# Patient Record
Sex: Female | Born: 1990
Health system: Southern US, Community
[De-identification: ages and names within clinical notes are randomized; demographics above are authoritative.]

## PROBLEM LIST (undated history)

## (undated) ENCOUNTER — Inpatient Hospital Stay: Payer: Self-pay

## (undated) DIAGNOSIS — R519 Headache, unspecified: Secondary | ICD-10-CM

## (undated) DIAGNOSIS — R51 Headache: Secondary | ICD-10-CM

## (undated) DIAGNOSIS — Z8759 Personal history of other complications of pregnancy, childbirth and the puerperium: Secondary | ICD-10-CM

## (undated) HISTORY — PX: NO PAST SURGERIES: SHX2092

## (undated) HISTORY — DX: Headache, unspecified: R51.9

## (undated) HISTORY — DX: Headache: R51

## (undated) HISTORY — DX: Personal history of other complications of pregnancy, childbirth and the puerperium: Z87.59

---
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
## 2012-12-25 ENCOUNTER — Ambulatory Visit: Payer: Self-pay | Admitting: Internal Medicine

## 2012-12-31 ENCOUNTER — Ambulatory Visit (INDEPENDENT_AMBULATORY_CARE_PROVIDER_SITE_OTHER): Payer: Managed Care, Other (non HMO) | Admitting: Internal Medicine

## 2012-12-31 ENCOUNTER — Encounter: Payer: Self-pay | Admitting: Internal Medicine

## 2012-12-31 VITALS — BP 102/70 | HR 91 | Temp 97.6°F | Ht 65.0 in | Wt 159.0 lb

## 2012-12-31 DIAGNOSIS — Z3009 Encounter for other general counseling and advice on contraception: Secondary | ICD-10-CM

## 2012-12-31 DIAGNOSIS — J029 Acute pharyngitis, unspecified: Secondary | ICD-10-CM

## 2012-12-31 DIAGNOSIS — Z0000 Encounter for general adult medical examination without abnormal findings: Secondary | ICD-10-CM

## 2012-12-31 LAB — BASIC METABOLIC PANEL
Chloride: 105 mEq/L (ref 96–112)
GFR: 81.26 mL/min (ref >60.00)
Glucose, Bld: 68 mg/dL — ABNORMAL LOW (ref 70–99)
Potassium: 4.1 mEq/L (ref 3.5–5.1)
Sodium: 138 mEq/L (ref 135–145)

## 2012-12-31 LAB — POCT URINE PREGNANCY: Preg Test, Ur: NEGATIVE

## 2012-12-31 LAB — LIPID PANEL
Cholesterol: 153 mg/dL (ref 0–200)
HDL: 37.7 mg/dL — ABNORMAL LOW (ref >39.00)
LDL Cholesterol: 88 mg/dL (ref 0–99)
VLDL: 27 mg/dL (ref 0.0–40.0)

## 2012-12-31 LAB — CBC
MCV: 92.7 fl (ref 78.0–100.0)
RBC: 4.1 Mil/uL (ref 3.87–5.11)
RDW: 12.8 % (ref 11.5–14.6)
WBC: 6 10*3/uL (ref 4.5–10.5)

## 2012-12-31 LAB — TSH: TSH: 0.44 u[IU]/mL (ref 0.35–5.50)

## 2012-12-31 LAB — POCT RAPID STREP A (OFFICE): Rapid Strep A Screen: NEGATIVE

## 2012-12-31 MED ORDER — AMOXICILLIN 500 MG PO CAPS: 500.0000 mg | ORAL_CAPSULE | Freq: Three times a day (TID) | ORAL | Status: DC

## 2012-12-31 MED ORDER — PREDNISONE 10 MG PO TABS: ORAL_TABLET | ORAL | Status: DC

## 2012-12-31 MED ORDER — MEDROXYPROGESTERONE ACETATE 150 MG/ML IM SUSP
150.0000 mg | Freq: Once | INTRAMUSCULAR | Status: AC
  Administered 2012-12-31: 150 mg via INTRAMUSCULAR

## 2012-12-31 NOTE — Addendum Note (Signed)
Addended by: Lorre Munroe on: 12/31/2012 11:09 AM   Modules accepted: Orders

## 2012-12-31 NOTE — Patient Instructions (Signed)
Health Maintenance, 20- to 21-Year-Old SCHOOL PERFORMANCE After high school completion, the young adult may be attending college, technical or vocational school, or entering the military or the work force. SOCIAL AND EMOTIONAL DEVELOPMENT The young adult establishes adult relationships and explores sexual identity. Young adults may be living at home or in a college dorm or apartment. Increasing independence is important with young adults. Throughout adolescence, teens should assume responsibility of their own health care. IMMUNIZATIONS Most young adults should be fully vaccinated. A booster dose of Tdap (tetanus, diphtheria, and pertussis, or "whooping cough"), a dose of meningococcal vaccine to protect against a certain type of bacterial meningitis, hepatitis A, human papillomarvirus (HPV), chickenpox, or measles vaccines may be indicated, if not given at an earlier age. Annual influenza or "flu" vaccination should be considered during flu season.  TESTING Annual screening for vision and hearing problems is recommended. Vision should be screened objectively at least once between 20 and 21 years of age. The young adult may be screened for anemia or tuberculosis. Young adults should have a blood test to check for high cholesterol during this time period. Young adults should be screened for use of alcohol and drugs. If the young adult is sexually active, screening for sexually transmitted infections, pregnancy, or HIV may be performed. Screening for cervical cancer should be performed within 3 years of beginning sexual activity. NUTRITION AND ORAL HEALTH  Adequate calcium intake is important. Consume 3 servings of low-fat milk and dairy products daily. For those who do not drink milk or consume dairy products, calcium enriched foods, such as juice, bread, or cereal, dark, leafy greens, or canned fish are alternate sources of calcium.  Drink plenty of water. Limit fruit juice to 8 to 12 ounces per day.  Avoid sugary beverages or sodas.  Discourage skipping meals, especially breakfast. Teens should eat a good variety of vegetables and fruits, as well as lean meats.  Avoid high fat, high salt, and high sugar foods, such as candy, chips, and cookies.  Encourage young adults to participate in meal planning and preparation.  Eat meals together as a family whenever possible. Encourage conversation at mealtime.  Limit fast food choices and eating out at restaurants.  Brush teeth twice a day and floss.  Schedule dental exams twice a year. SLEEP Regular sleep habits are important. PHYSICAL, SOCIAL, AND EMOTIONAL DEVELOPMENT  One hour of regular physical activity daily is recommended. Continue to participate in sports.  Encourage young adults to develop their own interests and consider community service or volunteerism.  Provide guidance to the young adult in making decisions about college and work plans.  Make sure that young adults know that they should never be in a situation that makes them uncomfortable, and they should tell partners if they do not want to engage in sexual activity.  Talk to the young adult about body image. Eating disorders may be noted at this time. Young adults may also be concerned about being overweight. Monitor the young adult for weight gain or loss.  Mood disturbances, depression, anxiety, alcoholism, or attention problems may be noted in young adults. Talk to the caregiver if there are concerns about mental illness.  Negotiate limit setting and independent decision making.  Encourage the young adult to handle conflict without physical violence.  Avoid loud noises which may impair hearing.  Limit television and computer time to 2 hours per day. Individuals who engage in excessive sedentary activity are more likely to become overweight. RISK BEHAVIORS  Sexually active   young adults need to take precautions against pregnancy and sexually transmitted  infections. Talk to young adults about contraception.  Provide a tobacco-free and drug-free environment for the young adult. Talk to the young adult about drug, tobacco, and alcohol use among friends or at friends' homes. Make sure the young adult knows that smoking tobacco or marijuana and taking drugs have health consequences and may impact brain development.  Teach the young adult about appropriate use of over-the-counter or prescription medicines.  Establish guidelines for driving and for riding with friends.  Talk to young adults about the risks of drinking and driving or boating. Encourage the young adult to call you if he or she or friends have been drinking or using drugs.  Remind young adults to wear seat belts at all times in cars and life vests in boats.  Young adults should always wear a properly fitted helmet when they are riding a bicycle.  Use caution with all-terrain vehicles (ATVs) or other motorized vehicles.  Do not keep handguns in the home. (If you do, the gun and ammunition should be locked separately and out of the young adult's access.)  Equip your home with smoke detectors and change the batteries regularly. Make sure all family members know the fire escape plans for your home.  Teach young adults not to swim alone and not to dive in shallow water.  All individuals should wear sunscreen that protects against UVA and UVB light with at least a sun protection factor (SPF) of 30 when out in the sun. This minimizes sun burning. WHAT'S NEXT? Young adults should visit their pediatrician or family physician yearly. By young adulthood, health care should be transitioned to a family physician or internal medicine specialist. Sexually active females may want to begin annual physical exams with a gynecologist. Document Released: 06/22/2006 Document Revised: 06/19/2011 Document Reviewed: 07/12/2006 Exit[employer] Patient Information 2014 [employer].  

## 2012-12-31 NOTE — Progress Notes (Signed)
HPI  Pt presents to the clinic today to establish care. She is transferring care from [facility]. She does have some concerns today with ongoing URI. This has persisted for the last 3 weeks. She has been seen at Urgent Care twice for the same. The first time, they gave her azithromax and cough syrup with codeine. She went back after having an allergic reaction to the codeine. She was given a steroid injection, put on a prednisone taper, given tessalon pearls. Since that time, her symptoms have not gotten any better. She still c/o a sore throat, fatigue and drainage. She has not had fevers in the last week but she has developed a rash. She has been taking Zyrtec, Sudafed and Ibuprofen OTC without much relief. She has a history of allergies but denies asthma or breathing problems.  Flu: never Tetanus: 2012 LMP: 12/02/2012 Dentist: yearly  History reviewed. No pertinent past medical history.  Current Outpatient Prescriptions  Medication Sig Dispense Refill  . cetirizine (ZYRTEC) 10 MG tablet Take 10 mg by mouth daily.       No current facility-administered medications for this visit.    Allergies  Allergen Reactions  . Codeine Rash    Not sure.  Had a break-out prior to Codeine but it became much worse after starting Codeine.    History reviewed. No pertinent family history.  History   Social History  . Marital Status: Single    Spouse Name: N/A    Number of Children: N/A  . Years of Education: N/A   Occupational History  . Not on file.   Social History Main Topics  . Smoking status: Never Smoker   . Smokeless tobacco: Never Used  . Alcohol Use: No  . Drug Use: No  . Sexual Activity: Yes    Partners: Female   Other Topics Concern  . Not on file   Social History Narrative  . No narrative on file    ROS:  Constitutional: Pt reports fatigue and headache. Denies fever, malaise, or abrupt weight changes.  HEENT: Pt reports sore throat. Denies eye pain, eye  redness, ear pain, ringing in the ears, wax buildup, runny nose, nasal congestion, bloody nose Respiratory: Denies difficulty breathing, shortness of breath, cough or sputum production.   Cardiovascular: Denies chest pain, chest tightness, palpitations or swelling in the hands or feet.  Gastrointestinal: Denies abdominal pain, bloating, constipation, diarrhea or blood in the stool.  GU: Denies frequency, urgency, pain with urination, blood in urine, odor or discharge. Musculoskeletal: Pt reports right knee pain. Denies decrease in range of motion, difficulty with gait, muscle pain or joint swelling.  Skin: Pt reports rash. Denies redness, lesions or ulcercations.  Neurological: Denies dizziness, difficulty with memory, difficulty with speech or problems with balance and coordination.   No other specific complaints in a complete review of systems (except as listed in HPI above).  PE:  BP 102/70  Pulse 91  Temp(Src) 97.6 F (36.4 C) (Oral)  Ht 5\' 5"  (1.651 m)  Wt 159 lb (72.122 kg)  BMI 26.46 kg/m2  SpO2 97%  LMP 12/02/2012 Wt Readings from Last 3 Encounters:  12/31/12 159 lb (72.122 kg)    General: Appears her stated age, well developed, well nourished in NAD. HEENT: Head: normal shape and size; Eyes: sclera white, no icterus, conjunctiva pink, PERRLA and EOMs intact; Ears: Tm's gray and intact, normal light reflex; Nose: mucosa pink and moist, septum midline; Throat/Mouth: Teeth present, mucosa erythematous and moist, yellow exudate noted on  bilateral tonsillar pillars, no lesions or ulcerations noted.  Neck: Normal range of motion. Neck supple, trachea midline. No massses, lumps or thyromegaly present.  Cardiovascular: Normal rate and rhythm. S1,S2 noted.  No murmur, rubs or gallops noted. No JVD or BLE edema. No carotid bruits noted. Pulmonary/Chest: Normal effort and positive vesicular breath sounds. No respiratory distress. No wheezes, rales or ronchi noted.  Abdomen: Soft and  nontender. Normal bowel sounds, no bruits noted. No distention or masses noted. Liver, spleen and kidneys non palpable. Musculoskeletal: Normal range of motion. No signs of joint swelling. No difficulty with gait.  Neurological: Alert and oriented. Cranial nerves II-XII intact. Coordination normal. +DTRs bilaterally. Psychiatric: Mood and affect normal. Behavior is normal. Judgment and thought content normal.      Assessment and Plan:  Preventative Health Maintenance:  Pt declines flu shot today Will start pap smears when you turn 23 Screening lab work today Birth control counseling today- pt was on Depo in the past, would like to resume Urine HcG-negative, 150 mg Depo Provera today  RTC in 3 months for repeat Depo injection  Acute pharyngitis, likely strep:  Will repeat RST and send off throat culture eRx for Amoxicillin TID x 10 days eRx for pred taper x 6 days

## 2012-12-31 NOTE — Addendum Note (Signed)
Addended by: Annamarie Major on: 12/31/2012 11:23 AM   Modules accepted: Orders

## 2012-12-31 NOTE — Addendum Note (Signed)
Addended by: Alvina Chou on: 12/31/2012 11:13 AM   Modules accepted: Orders

## 2013-01-02 LAB — CULTURE, GROUP A STREP

## 2013-04-28 ENCOUNTER — Ambulatory Visit (INDEPENDENT_AMBULATORY_CARE_PROVIDER_SITE_OTHER): Payer: Managed Care, Other (non HMO) | Admitting: Internal Medicine

## 2013-04-28 ENCOUNTER — Encounter: Payer: Self-pay | Admitting: Internal Medicine

## 2013-04-28 VITALS — BP 116/78 | HR 98 | Temp 98.5°F | Wt 176.5 lb

## 2013-04-28 DIAGNOSIS — Z30011 Encounter for initial prescription of contraceptive pills: Secondary | ICD-10-CM

## 2013-04-28 DIAGNOSIS — R110 Nausea: Secondary | ICD-10-CM

## 2013-04-28 DIAGNOSIS — R635 Abnormal weight gain: Secondary | ICD-10-CM

## 2013-04-28 DIAGNOSIS — Z3009 Encounter for other general counseling and advice on contraception: Secondary | ICD-10-CM

## 2013-04-28 LAB — POCT URINE PREGNANCY: PREG TEST UR: NEGATIVE

## 2013-04-28 LAB — TSH: TSH: 0.58 u[IU]/mL (ref 0.35–5.50)

## 2013-04-28 MED ORDER — NORGESTIMATE-ETH ESTRADIOL 0.25-35 MG-MCG PO TABS: 1.0000 | ORAL_TABLET | Freq: Every day | ORAL | Status: DC

## 2013-04-28 NOTE — Progress Notes (Signed)
Subjective:    Patient ID: Jaime Mcintosh, female    DOB: [birth year], 23 y.o.   MRN: [id number]  HPI  Pt presents to the clinic today with c/o weight gain. She has had a 16 lb weight gain in the last 6 weeks. She has not had any changes in her diet. She has felt more nauseated. She is sexually active. Her last Depo was in 12/2011. She has not hat a menstrual since her Depo injection. She also wants to talk about other forms of birth control if the Depo is causing her weight gain.   Review of Systems      History reviewed. No pertinent past medical history.  No current outpatient prescriptions on file.   No current facility-administered medications for this visit.    Allergies  Allergen Reactions  . Codeine Rash    Not sure.  Had a break-out prior to Codeine but it became much worse after starting Codeine.    Family History  Problem Relation Age of Onset  . Heart disease Maternal Grandfather   . Diabetes Paternal Grandfather   . Cancer Neg Hx   . Stroke Neg Hx     History   Social History  . Marital Status: Single    Spouse Name: N/A    Number of Children: N/A  . Years of Education: N/A   Occupational History  . Not on file.   Social History Main Topics  . Smoking status: Never Smoker   . Smokeless tobacco: Never Used  . Alcohol Use: No  . Drug Use: No  . Sexual Activity: Yes    Partners: Female   Other Topics Concern  . Not on file   Social History Narrative  . No narrative on file     Constitutional: Denies fever, malaise, fatigue, headache .  GU: Denies urgency, frequency, pain with urination, burning sensation, blood in urine, odor or discharge.   No other specific complaints in a complete review of systems (except as listed in HPI above).  Objective:   Physical Exam   BP 116/78  Pulse 98  Temp(Src) 98.5 F (36.9 C) (Oral)  Wt 176 lb 8 oz (80.06 kg)  SpO2 99%  LMP 12/25/2012 Wt Readings from Last 3 Encounters:  04/28/13 176 lb 8 oz  (80.06 kg)  12/31/12 159 lb (72.122 kg)    General: Appears her stated age, overweight but well developed, well nourished in NAD. Cardiovascular: Normal rate and rhythm. S1,S2 noted.  No murmur, rubs or gallops noted. No JVD or BLE edema. No carotid bruits noted. Pulmonary/Chest: Normal effort and positive vesicular breath sounds. No respiratory distress. No wheezes, rales or ronchi noted.  Abdomen: Soft and nontender. Normal bowel sounds, no bruits noted. No distention or masses noted. Liver, spleen and kidneys non palpable.  BMET    Component Value Date/Time   NA 138 12/31/2012 1113   K 4.1 12/31/2012 1113   CL 105 12/31/2012 1113   CO2 29 12/31/2012 1113   GLUCOSE 68* 12/31/2012 1113   BUN 12 12/31/2012 1113   CREATININE 0.9 12/31/2012 1113   CALCIUM 8.8 12/31/2012 1113    Lipid Panel     Component Value Date/Time   CHOL 153 12/31/2012 1113   TRIG 135.0 12/31/2012 1113   HDL 37.70* 12/31/2012 1113   CHOLHDL 4 12/31/2012 1113   VLDL 27.0 12/31/2012 1113   LDLCALC 88 12/31/2012 1113    CBC    Component Value Date/Time   WBC 6.0 12/31/2012 1113  RBC 4.10 12/31/2012 1113   HGB 13.4 12/31/2012 1113   HCT 38.0 12/31/2012 1113   PLT 274.0 12/31/2012 1113   MCV 92.7 12/31/2012 1113   MCHC 35.2 12/31/2012 1113   RDW 12.8 12/31/2012 1113    Hgb A1C No results found for this basename: HGBA1C        Assessment & Plan:   Weight gain with intermittent nausea:  Urine Hcg negative Will check TSH although likely related to diet/Depo Discussed birth control options. I think the Implanon or the Christean GriefSkyla would be the best options for her. She will try OCP until she decides what form of birth control she will go with eRx for Sprinctec  RTC as needed

## 2013-04-28 NOTE — Progress Notes (Signed)
Pre-visit discussion using our clinic review tool. No additional management support is needed unless otherwise documented below in the visit note.  

## 2013-04-28 NOTE — Patient Instructions (Signed)
Serving Sizes What we call a serving size today is larger than it was in the past. A 1950s fast-food burger contained little more than 1 oz of meat, and a soft drink was 8 oz (1 cup). Today, a "quarter pounder" burger is at least 4 times that amount, and a 32 or 64 oz drink is not uncommon. A possible guide for eating when trying to lose weight is to eat about half as much as you normally do. Some estimates of serving sizes are:  1 Dairy serving:Individual container of yogurt (8 oz) or piece of cheese the size of your thumb (1 oz).  1 Grain serving: 1 slice of bread or  cup pasta.  1 Meat serving: The size of a deck of cards (3 oz).  1 Fruit serving: cup canned fruit or 1 medium fruit.  1 Vegetable serving:  cup of cooked or canned vegetables.  1 Fat serving:The size of 4 stacked dimes. Experts suggest spending 1 or 2 days measuring food portions you commonly eat. This will give you better practice at estimating serving sizes, and will also show whether you are eating an appropriate amount of food to meet your weight goals. If you find that you are eating more than you thought, try measuring your food for a few days so you can "reprogram" yourself to learn what makes a healthy portion for you. SUGGESTIONS FOR CONTROL  In restaurants, share entrees, or ask the waiter to put half the entre in a box or bag before you even touch it.  Order lunch-sized portions. Many restaurants serve 4 to 6 oz of meat at lunch, compared with 8 to 10 oz at dinner.  Split dessert or skip it all together. Have a piece of fruit when you get home.  At home, use smaller plates and bowls. It will look as if you are eating more.  Plate your food in the kitchen rather than serving it "family style" at the table.  Wait 20 to 30 minutes before taking seconds. This is how long it takes your brain to recognize that you are full.  Check food labels for serving sizes. Eat 1 serving only.  Use measuring cups and  spoons to see proper serving sizes.  Buy smaller packages of candy, popcorn, and snacks.  Avoid eating directly out of the bag or carton.  While eating half as much, exercise twice as much. Park further away from the mall, take the stairs instead of the escalator, and walk around your block. Losing weight is a slow, difficult process. It takes long-lasting lifestyle changes. You can make gradual changes over time so they become habits. Look to friends and family to support the healthy changes you are making. Avoid fad diets since they are often only temporary weight loss solutions. Document Released: 12/24/2002 Document Revised: 06/19/2011 Document Reviewed: 02/02/2009 [employer] Patient Information 2014 [employer].  

## 2015-11-08 LAB — OB RESULTS CONSOLE ABO/RH: RH Type: NEGATIVE

## 2015-12-16 ENCOUNTER — Ambulatory Visit (INDEPENDENT_AMBULATORY_CARE_PROVIDER_SITE_OTHER): Payer: Managed Care, Other (non HMO)

## 2015-12-16 ENCOUNTER — Ambulatory Visit (INDEPENDENT_AMBULATORY_CARE_PROVIDER_SITE_OTHER): Payer: Managed Care, Other (non HMO) | Admitting: Obstetrics and Gynecology

## 2015-12-16 VITALS — BP 111/67 | HR 80 | Wt 168.9 lb

## 2015-12-16 DIAGNOSIS — Z369 Encounter for antenatal screening, unspecified: Secondary | ICD-10-CM

## 2015-12-16 DIAGNOSIS — Z3490 Encounter for supervision of normal pregnancy, unspecified, unspecified trimester: Secondary | ICD-10-CM

## 2015-12-16 DIAGNOSIS — Z3401 Encounter for supervision of normal first pregnancy, first trimester: Secondary | ICD-10-CM

## 2015-12-16 DIAGNOSIS — Z36 Encounter for antenatal screening of mother: Secondary | ICD-10-CM

## 2015-12-16 DIAGNOSIS — O26841 Uterine size-date discrepancy, first trimester: Secondary | ICD-10-CM

## 2015-12-16 DIAGNOSIS — Z1389 Encounter for screening for other disorder: Secondary | ICD-10-CM

## 2015-12-16 DIAGNOSIS — Z113 Encounter for screening for infections with a predominantly sexual mode of transmission: Secondary | ICD-10-CM

## 2015-12-16 LAB — OB RESULTS CONSOLE VARICELLA ZOSTER ANTIBODY, IGG: VARICELLA IGG: NON-IMMUNE/NOT IMMUNE

## 2015-12-16 NOTE — Patient Instructions (Signed)
Pregnancy and Zika Virus Disease Zika virus disease, or Zika, is an illness that can spread to people from mosquitoes that carry the virus. It may also spread from person to person through infected body fluids. Zika first occurred in [place], but recently it has spread to new areas. The virus occurs in tropical climates. The location of Zika continues to change. Most people who become infected with Zika virus do not develop serious illness. However, Zika may cause birth defects in an unborn baby whose mother is infected with the virus. It may also increase the risk of miscarriage. WHAT ARE THE SYMPTOMS OF ZIKA VIRUS DISEASE? In many cases, people who have been infected with Zika virus do not develop any symptoms. If symptoms appear, they usually start about a week after the person is infected. Symptoms are usually mild. They may include:  Fever.  Rash.  Red eyes.  Joint pain. HOW DOES ZIKA VIRUS DISEASE SPREAD? The main way that Zika virus spreads is through the bite of a certain type of mosquito. Unlike most types of mosquitos, which bite only at night, the type of mosquito that carries Zika virus bites both at night and during the day. Zika virus can also spread through sexual contact, through a blood transfusion, and from a mother to her baby before or during birth. Once you have had Zika virus disease, it is unlikely that you will get it again. CAN I PASS ZIKA TO MY BABY DURING PREGNANCY? Yes, Zika can pass from a mother to her baby before or during birth. WHAT PROBLEMS CAN ZIKA CAUSE FOR MY BABY? A woman who is infected with Zika virus while pregnant is at risk of having her baby born with a condition in which the brain or head is smaller than expected (microcephaly). Babies who have microcephaly can have developmental delays, seizures, hearing problems, and vision problems. Having Zika virus disease during pregnancy can also increase the risk of miscarriage. HOW CAN ZIKA VIRUS DISEASE BE  PREVENTED? There is no vaccine to prevent Zika. The best way to prevent the disease is to avoid infected mosquitoes and avoid exposure to body fluids that can spread the virus. Avoid any possible exposure to Zika by taking the following precautions. For women and their sex partners:  Avoid traveling to high-risk areas. The locations where Zika is being reported change often. To identify high-risk areas, check the CDC travel website: www.cdc.gov/zika/geo/index.html  If you or your sex partner must travel to a high-risk area, talk with a health care provider before and after traveling.  Take all precautions to avoid mosquito bites if you live in, or travel to, any of the high-risk areas. Insect repellents are safe to use during pregnancy.  Ask your health care provider when it is safe to have sexual contact. For women:  If you are pregnant or trying to become pregnant, avoid sexual contact with persons who may have been exposed to Zika virus, persons who have possible symptoms of Zika, or persons whose history you are unsure about. If you choose to have sexual contact with someone who may have been exposed to Zika virus, use condoms correctly during the entire duration of sexual activity, every time. Do not share sexual devices, as you may be exposed to body fluids.  Ask your health care provider about when it is safe to attempt pregnancy after a possible exposure to Zika virus. WHAT STEPS SHOULD I TAKE TO AVOID MOSQUITO BITES? Take these steps to avoid mosquito bites when you are   in a high-risk area:  Wear loose clothing that covers your arms and legs.  Limit your outdoor activities.  Do not open windows unless they have window screens.  Sleep under mosquito nets.  Use insect repellent. The best insect repellents have:  DEET, picaridin, oil of lemon eucalyptus (OLE), or [id number] in them.  Higher amounts of an active ingredient in them.  Remember that insect repellents are safe to use  during pregnancy.  Do not use OLE on children who are younger than 3 years of age. Do not use insect repellent on babies who are younger than 2 months of age.  Cover your child's stroller with mosquito netting. Make sure the netting fits snugly and that any loose netting does not cover your child's mouth or nose. Do not use a blanket as a mosquito-protection cover.  Do not apply insect repellent underneath clothing.  If you are using sunscreen, apply the sunscreen before applying the insect repellent.  Treat clothing with permethrin. Do not apply permethrin directly to your skin. Follow label directions for safe use.  Get rid of standing water, where mosquitoes may reproduce. Standing water is often found in items such as buckets, bowls, animal food dishes, and flowerpots. When you return from traveling to any high-risk area, continue taking actions to protect yourself against mosquito bites for 3 weeks, even if you show no signs of illness. This will prevent spreading Zika virus to uninfected mosquitoes. WHAT SHOULD I KNOW ABOUT THE SEXUAL TRANSMISSION OF ZIKA? People can spread Zika to their sexual partners during vaginal, anal, or oral sex, or by sharing sexual devices. Many people with Zika do not develop symptoms, so a person could spread the disease without knowing that they are infected. The greatest risk is to women who are pregnant or who may become pregnant. Zika virus can live longer in semen than it can live in blood. Couples can prevent sexual transmission of the virus by:  Using condoms correctly during the entire duration of sexual activity, every time. This includes vaginal, anal, and oral sex.  Not sharing sexual devices. Sharing increases your risk of being exposed to body fluid from another person.  Avoiding all sexual activity until your health care provider says it is safe. SHOULD I BE TESTED FOR ZIKA VIRUS? A sample of your blood can be tested for Zika virus. A pregnant  woman should be tested if she may have been exposed to the virus or if she has symptoms of Zika. She may also have additional tests done during her pregnancy, such ultrasound testing. Talk with your health care provider about which tests are recommended.   This information is not intended to replace advice given to you by your health care provider. Make sure you discuss any questions you have with your health care provider.   Document Released: 12/16/2014 Document Reviewed: 12/09/2014 Elsevier Interactive Patient Education 2016 [employer] Minor Illnesses and Medications in Pregnancy  Cold/Flu:  Sudafed for congestion- Robitussin (plain) for cough- Tylenol for discomfort.  Please follow the directions on the label.  Try not to take any more than needed.  OTC Saline nasal spray and air humidifier or cool-mist  Vaporizer to sooth nasal irritation and to loosen congestion.  It is also important to increase intake of non carbonated fluids, especially if you have a fever.  Constipation:  Colace-2 capsules at bedtime; Metamucil- follow directions on label; Senokot- 1 tablet at bedtime.  Any one of these medications can be used.  It is also   very important to increase fluids and fruits along with regular exercise.  If problem persists please call the office.  Diarrhea:  Kaopectate as directed on the label.  Eat a bland diet and increase fluids.  Avoid highly seasoned foods.  Headache:  Tylenol 1 or 2 tablets every 3-4 hours as needed  Indigestion:  Maalox, Mylanta, Tums or Rolaids- as directed on label.  Also try to eat small meals and avoid fatty, greasy or spicy foods.  Nausea with or without Vomiting:  Nausea in pregnancy is caused by increased levels of hormones in the body which influence the digestive system and cause irritation when stomach acids accumulate.  Symptoms usually subside after 1st trimester of pregnancy.  Try the following: 1. Keep saltines, graham crackers or dry toast by your bed  to eat upon awakening. 2. Don't let your stomach get empty.  Try to eat 5-6 small meals per day instead of 3 large ones. 3. Avoid greasy fatty or highly seasoned foods.  4. Take OTC Unisom 1 tablet at bed time along with OTC Vitamin B6 25-50 mg 3 times per day.    If nausea continues with vomiting and you are unable to keep down food and fluids you may need a prescription medication.  Please notify your provider.   Sore throat:  Chloraseptic spray, throat lozenges and or plain Tylenol.  Vaginal Yeast Infection:  OTC Monistat for 7 days as directed on label.  If symptoms do not resolve within a week notify provider.  If any of the above problems do not subside with recommended treatment please call the office for further assistance.   Do not take Aspirin, Advil, Motrin or Ibuprofen.  * * OTC= Over the counter Hyperemesis Gravidarum Hyperemesis gravidarum is a severe form of nausea and vomiting that happens during pregnancy. Hyperemesis is worse than morning sickness. It may cause you to have nausea or vomiting all day for many days. It may keep you from eating and drinking enough food and liquids. Hyperemesis usually occurs during the first half (the first 20 weeks) of pregnancy. It often goes away once a woman is in her second half of pregnancy. However, sometimes hyperemesis continues through an entire pregnancy.  CAUSES  The cause of this condition is not completely known but is thought to be related to changes in the body's hormones when pregnant. It could be from the high level of the pregnancy hormone or an increase in estrogen in the body.  SIGNS AND SYMPTOMS   Severe nausea and vomiting.  Nausea that does not go away.  Vomiting that does not allow you to keep any food down.  Weight loss and body fluid loss (dehydration).  Having no desire to eat or not liking food you have previously enjoyed. DIAGNOSIS  Your health care provider will do a physical exam and ask you about your  symptoms. He or she may also order blood tests and urine tests to make sure something else is not causing the problem.  TREATMENT  You may only need medicine to control the problem. If medicines do not control the nausea and vomiting, you will be treated in the hospital to prevent dehydration, increased acid in the blood (acidosis), weight loss, and changes in the electrolytes in your body that may harm the unborn baby (fetus). You may need IV fluids.  HOME CARE INSTRUCTIONS   Only take over-the-counter or prescription medicines as directed by your health care provider.  Try eating a couple of dry crackers or   toast in the morning before getting out of bed.  Avoid foods and smells that upset your stomach.  Avoid fatty and spicy foods.  Eat 5-6 small meals a day.  Do not drink when eating meals. Drink between meals.  For snacks, eat high-protein foods, such as cheese.  Eat or suck on things that have ginger in them. Ginger helps nausea.  Avoid food preparation. The smell of food can spoil your appetite.  Avoid iron pills and iron in your multivitamins until after 3-4 months of being pregnant. However, consult with your health care provider before stopping any prescribed iron pills. SEEK MEDICAL CARE IF:   Your abdominal pain increases.  You have a severe headache.  You have vision problems.  You are losing weight. SEEK IMMEDIATE MEDICAL CARE IF:   You are unable to keep fluids down.  You vomit blood.  You have constant nausea and vomiting.  You have excessive weakness.  You have extreme thirst.  You have dizziness or fainting.  You have a fever or persistent symptoms for more than 2-3 days.  You have a fever and your symptoms suddenly get worse. MAKE SURE YOU:   Understand these instructions.  Will watch your condition.  Will get help right away if you are not doing well or get worse.   This information is not intended to replace advice given to you by your  health care provider. Make sure you discuss any questions you have with your health care provider.   Document Released: 03/27/2005 Document Revised: 01/15/2013 Document Reviewed: 11/06/2012 Elsevier Interactive Patient Education 2016 [employer] Commonly Asked Questions During Pregnancy  Cats: A parasite can be excreted in cat feces.  To avoid exposure you need to have another person empty the little box.  If you must empty the litter box you will need to wear gloves.  Wash your hands after handling your cat.  This parasite can also be found in raw or undercooked meat so this should also be avoided.  Colds, Sore Throats, Flu: Please check your medication sheet to see what you can take for symptoms.  If your symptoms are unrelieved by these medications please call the office.  Dental Work: Most any dental work your dentist recommends is permitted.  X-rays should only be taken during the first trimester if absolutely necessary.  Your abdomen should be shielded with a lead apron during all x-rays.  Please notify your provider prior to receiving any x-rays.  Novocaine is fine; gas is not recommended.  If your dentist requires a note from us prior to dental work please call the office and we will provide one for you.  Exercise: Exercise is an important part of staying healthy during your pregnancy.  You may continue most exercises you were accustomed to prior to pregnancy.  Later in your pregnancy you will most likely notice you have difficulty with activities requiring balance like riding a bicycle.  It is important that you listen to your body and avoid activities that put you at a higher risk of falling.  Adequate rest and staying well hydrated are a must!  If you have questions about the safety of specific activities ask your provider.    Exposure to Children with illness: Try to avoid obvious exposure; report any symptoms to us when noted,  If you have chicken pos, red measles or mumps, you should  be immune to these diseases.   Please do not take any vaccines while pregnant unless you have checked with   your OB provider.  Fetal Movement: After 28 weeks we recommend you do "kick counts" twice daily.  Lie or sit down in a calm quiet environment and count your baby movements "kicks".  You should feel your baby at least 10 times per hour.  If you have not felt 10 kicks within the first hour get up, walk around and have something sweet to eat or drink then repeat for an additional hour.  If count remains less than 10 per hour notify your provider.  Fumigating: Follow your pest control agent's advice as to how long to stay out of your home.  Ventilate the area well before re-entering.  Hemorrhoids:   Most over-the-counter preparations can be used during pregnancy.  Check your medication to see what is safe to use.  It is important to use a stool softener or fiber in your diet and to drink lots of liquids.  If hemorrhoids seem to be getting worse please call the office.   Hot Tubs:  Hot tubs Jacuzzis and saunas are not recommended while pregnant.  These increase your internal body temperature and should be avoided.  Intercourse:  Sexual intercourse is safe during pregnancy as long as you are comfortable, unless otherwise advised by your provider.  Spotting may occur after intercourse; report any bright red bleeding that is heavier than spotting.  Labor:  If you know that you are in labor, please go to the hospital.  If you are unsure, please call the office and let us help you decide what to do.  Lifting, straining, etc:  If your job requires heavy lifting or straining please check with your provider for any limitations.  Generally, you should not lift items heavier than that you can lift simply with your hands and arms (no back muscles)  Painting:  Paint fumes do not harm your pregnancy, but may make you ill and should be avoided if possible.  Latex or water based paints have less odor than oils.   Use adequate ventilation while painting.  Permanents & Hair Color:  Chemicals in hair dyes are not recommended as they cause increase hair dryness which can increase hair loss during pregnancy.  " Highlighting" and permanents are allowed.  Dye may be absorbed differently and permanents may not hold as well during pregnancy.  Sunbathing:  Use a sunscreen, as skin burns easily during pregnancy.  Drink plenty of fluids; avoid over heating.  Tanning Beds:  Because their possible side effects are still unknown, tanning beds are not recommended.  Ultrasound Scans:  Routine ultrasounds are performed at approximately 20 weeks.  You will be able to see your baby's general anatomy an if you would like to know the gender this can usually be determined as well.  If it is questionable when you conceived you may also receive an ultrasound early in your pregnancy for dating purposes.  Otherwise ultrasound exams are not routinely performed unless there is a medical necessity.  Although you can request a scan we ask that you pay for it when conducted because insurance does not cover " patient request" scans.  Work: If your pregnancy proceeds without complications you may work until your due date, unless your physician or employer advises otherwise.  Round Ligament Pain/Pelvic Discomfort:  Sharp, shooting pains not associated with bleeding are fairly common, usually occurring in the second trimester of pregnancy.  They tend to be worse when standing up or when you remain standing for long periods of time.  These are the result   of pressure of certain pelvic ligaments called "round ligaments".  Rest, Tylenol and heat seem to be the most effective relief.  As the womb and fetus grow, they rise out of the pelvis and the discomfort improves.  Please notify the office if your pain seems different than that described.  It may represent a more serious condition.   

## 2015-12-16 NOTE — Progress Notes (Signed)
Jaime Mcintosh presents for NOB nurse interview visit. G-1.  P-0. [facility]. Unsure of LMP. UPT: positive. Also had Bhcg's: 11/08/15 (901); and ABO/RH (O Negative); 11/10/15 Bhcg: (2210); 11/12/2015 Bhcg: 5166. Ultrasound done on 11/18/2015 presents with gestational sac 4.4 wks. For size less than dates. Will repeat today.   Pregnancy education material explained and given. No cats in the home. NOB labs ordered.  HIV labs and Drug screen were explained optional and she could opt out of tests but did not decline. Drug screen ordered. PNV encouraged. Unsure of genetic screening. May talk with provider for any further questions.  Pt. To follow up with provider in _3_ weeks for NOB physical.  All questions answered. To contact office if any further questions or concerns.  Ultrasound done today here resulted pregnancy of 10.3 weeks. EDD changed to 07/13/15 due to estimated LMP 10/06/2015 (end of June).

## 2015-12-17 ENCOUNTER — Encounter: Payer: Self-pay | Admitting: Obstetrics and Gynecology

## 2015-12-17 LAB — CBC WITH DIFFERENTIAL/PLATELET
BASOS ABS: 0 10*3/uL (ref 0.0–0.2)
Basos: 0 %
EOS (ABSOLUTE): 0.1 10*3/uL (ref 0.0–0.4)
Eos: 1 %
HEMATOCRIT: 38.2 % (ref 34.0–46.6)
Hemoglobin: 13.2 g/dL (ref 11.1–15.9)
Immature Grans (Abs): 0 10*3/uL (ref 0.0–0.1)
Immature Granulocytes: 0 %
LYMPHS ABS: 2 10*3/uL (ref 0.7–3.1)
Lymphs: 22 %
MCH: 30.8 pg (ref 26.6–33.0)
MCHC: 34.6 g/dL (ref 31.5–35.7)
MCV: 89 fL (ref 79–97)
MONOS ABS: 0.5 10*3/uL (ref 0.1–0.9)
Monocytes: 5 %
NEUTROS ABS: 6.6 10*3/uL (ref 1.4–7.0)
Neutrophils: 72 %
Platelets: 366 10*3/uL (ref 150–379)
RBC: 4.28 x10E6/uL (ref 3.77–5.28)
RDW: 12 % — AB (ref 12.3–15.4)
WBC: 9.2 10*3/uL (ref 3.4–10.8)

## 2015-12-17 LAB — URINALYSIS, ROUTINE W REFLEX MICROSCOPIC
BILIRUBIN UA: NEGATIVE
Glucose, UA: NEGATIVE
Ketones, UA: NEGATIVE
Leukocytes, UA: NEGATIVE
NITRITE UA: NEGATIVE
PH UA: 7 (ref 5.0–7.5)
Protein, UA: NEGATIVE
RBC UA: NEGATIVE
Specific Gravity, UA: 1.023 (ref 1.005–1.030)
UUROB: 0.2 mg/dL (ref 0.2–1.0)

## 2015-12-17 LAB — NICOTINE SCREEN, URINE: Cotinine Ql Scrn, Ur: NEGATIVE ng/mL

## 2015-12-17 LAB — MONITOR DRUG PROFILE 14(MW)
Amphetamine Scrn, Ur: NEGATIVE ng/mL
BARBITURATE SCREEN URINE: NEGATIVE ng/mL
BENZODIAZEPINE SCREEN, URINE: NEGATIVE ng/mL
BUPRENORPHINE, URINE: NEGATIVE ng/mL
CANNABINOIDS UR QL SCN: NEGATIVE ng/mL
CREATININE(CRT), U: 137.7 mg/dL (ref 20.0–300.0)
Cocaine (Metab) Scrn, Ur: NEGATIVE ng/mL
Fentanyl, Urine: NEGATIVE pg/mL
METHADONE SCREEN, URINE: NEGATIVE ng/mL
Meperidine Screen, Urine: NEGATIVE ng/mL
OXYCODONE+OXYMORPHONE UR QL SCN: NEGATIVE ng/mL
Opiate Scrn, Ur: NEGATIVE ng/mL
PHENCYCLIDINE QUANTITATIVE URINE: NEGATIVE ng/mL
Ph of Urine: 6.8 (ref 4.5–8.9)
Propoxyphene Scrn, Ur: NEGATIVE ng/mL
SPECIFIC GRAVITY: 1.022
Tramadol Screen, Urine: NEGATIVE ng/mL

## 2015-12-17 LAB — HIV ANTIBODY (ROUTINE TESTING W REFLEX): HIV SCREEN 4TH GENERATION: NONREACTIVE

## 2015-12-17 LAB — RPR: RPR Ser Ql: NONREACTIVE

## 2015-12-17 LAB — RUBELLA SCREEN: Rubella Antibodies, IGG: 15.7 index (ref >0.99)

## 2015-12-17 LAB — ANTIBODY SCREEN: ANTIBODY SCREEN: NEGATIVE

## 2015-12-17 LAB — VARICELLA ZOSTER ANTIBODY, IGG

## 2015-12-17 LAB — HEPATITIS B SURFACE ANTIGEN: HEP B S AG: NEGATIVE

## 2015-12-18 LAB — GC/CHLAMYDIA PROBE AMP
CHLAMYDIA, DNA PROBE: NEGATIVE
NEISSERIA GONORRHOEAE BY PCR: NEGATIVE

## 2015-12-20 ENCOUNTER — Other Ambulatory Visit: Payer: Self-pay

## 2015-12-20 DIAGNOSIS — R8271 Bacteriuria: Secondary | ICD-10-CM | POA: Insufficient documentation

## 2015-12-20 DIAGNOSIS — O2341 Unspecified infection of urinary tract in pregnancy, first trimester: Secondary | ICD-10-CM

## 2015-12-20 LAB — URINE CULTURE, OB REFLEX

## 2015-12-20 LAB — CULTURE, OB URINE

## 2015-12-20 MED ORDER — AMOXICILLIN 500 MG PO CAPS: 500.0000 mg | ORAL_CAPSULE | Freq: Three times a day (TID) | ORAL | 0 refills | Status: AC

## 2016-01-05 ENCOUNTER — Ambulatory Visit (INDEPENDENT_AMBULATORY_CARE_PROVIDER_SITE_OTHER): Payer: Managed Care, Other (non HMO) | Admitting: Obstetrics and Gynecology

## 2016-01-05 VITALS — BP 111/69 | HR 80 | Wt 174.1 lb

## 2016-01-05 DIAGNOSIS — Z124 Encounter for screening for malignant neoplasm of cervix: Secondary | ICD-10-CM

## 2016-01-05 DIAGNOSIS — E663 Overweight: Secondary | ICD-10-CM

## 2016-01-05 DIAGNOSIS — Z789 Other specified health status: Secondary | ICD-10-CM

## 2016-01-05 DIAGNOSIS — O09899 Supervision of other high risk pregnancies, unspecified trimester: Secondary | ICD-10-CM

## 2016-01-05 DIAGNOSIS — Z3402 Encounter for supervision of normal first pregnancy, second trimester: Secondary | ICD-10-CM

## 2016-01-05 DIAGNOSIS — Z8669 Personal history of other diseases of the nervous system and sense organs: Secondary | ICD-10-CM

## 2016-01-05 DIAGNOSIS — Z283 Underimmunization status: Secondary | ICD-10-CM

## 2016-01-05 DIAGNOSIS — R8271 Bacteriuria: Secondary | ICD-10-CM

## 2016-01-05 DIAGNOSIS — O360120 Maternal care for anti-D [Rh] antibodies, second trimester, not applicable or unspecified: Secondary | ICD-10-CM

## 2016-01-05 LAB — POCT URINALYSIS DIPSTICK
BILIRUBIN UA: NEGATIVE
Blood, UA: NEGATIVE
GLUCOSE UA: NEGATIVE
Ketones, UA: NEGATIVE
LEUKOCYTES UA: NEGATIVE
NITRITE UA: NEGATIVE
Protein, UA: NEGATIVE
Spec Grav, UA: 1.015
Urobilinogen, UA: NEGATIVE
pH, UA: 6

## 2016-01-05 NOTE — Progress Notes (Signed)
OBSTETRIC INITIAL PRENATAL VISIT  Subjective:    Jaime Mcintosh is being seen today for her first obstetrical visit.  This is not a planned pregnancy. She is a G1P0 female at [redacted]w[redacted]d gestation, Estimated Date of Delivery: 07/12/16 with patient's last menstrual period was 10/06/2015 (approximate) consistent with 4 week sono). Her obstetrical history is significant for none. Relationship with FOB: single (engaged), living together. Patient does intend to breast feed. Pregnancy history fully reviewed.    Obstetric History   G1   P0   T0   P0   A0   L0    SAB0   TAB0   Ectopic0   Multiple0   Live Births0     # Outcome Date GA Lbr Len/2nd Weight Sex Delivery Anes PTL Lv  1 Current               Gynecologic History:  No prior pap history.  Denies history of STIs.  Was not on contraception prior to pregnancy.    Past Medical History:  Diagnosis Date  . Headache    hx migraines    Family History  Problem Relation Age of Onset  . Heart disease Maternal Grandfather   . Diabetes Paternal Grandfather   . Arthritis Maternal Grandmother   . Hypertension Maternal Grandmother   . Varicose Veins Maternal Grandmother   . Vision loss Paternal Grandmother   . Cancer Mother   . Stroke Neg Hx     No past surgical history on file.   Social History   Social History  . Marital status: Single    Spouse name: N/A  . Number of children: N/A  . Years of education: N/A   Occupational History  . Not on file.   Social History Main Topics  . Smoking status: Never Smoker  . Smokeless tobacco: Never Used  . Alcohol use No  . Drug use: No  . Sexual activity: Yes    Partners: Female   Other Topics Concern  . Not on file   Social History Narrative   Works nights        Current Outpatient Prescriptions on File Prior to Visit  Medication Sig Dispense Refill  . Prenatal Vit-Fe Fumarate-FA (PNV PRENATAL PLUS MULTIVITAMIN) 27-1 MG TABS Take 1 tablet by mouth daily.  3   No current  facility-administered medications on file prior to visit.      No Known Allergies   Review of Systems General:Not Present- Fever, Weight Loss and Weight Gain. Skin:Not Present- Rash. HEENT:Not Present- Blurred Vision, Headache and Bleeding Gums. Respiratory:Not Present- Difficulty Breathing. Breast:Not Present- Breast Mass. Cardiovascular:Not Present- Chest Pain, Elevated Blood Pressure, Fainting / Blacking Out and Shortness of Breath. Gastrointestinal:Present - Nausea (improving with dietary modifications). Not Present- Abdominal Pain, Constipation, Vomiting. Female Genitourinary:Not Present- Frequency, Painful Urination, Pelvic Pain, Vaginal Bleeding, Vaginal Discharge, Contractions, regular, Fetal Movements Decreased, Urinary Complaints and Vaginal Fluid. Musculoskeletal:Not Present- Back Pain and Leg Cramps. Neurological:Not Present- Dizziness. Psychiatric:Not Present- Depression.     Objective:   Blood pressure 111/69, pulse 80, weight 174 lb 1.6 oz (79 kg), last menstrual period 10/06/2015.  Body mass index is 28.97 kg/m.  General Appearance:    Alert, cooperative, no distress, appears stated age, overweight  Head:    Normocephalic, without obvious abnormality, atraumatic  Eyes:    PERRL, conjunctiva/corneas clear, EOM's intact, both eyes  Ears:    Normal external ear canals, both ears  Nose:   Nares normal, septum midline, mucosa normal, no drainage  or sinus tenderness  Throat:   Lips, mucosa, and tongue normal; teeth and gums normal  Neck:   Supple, symmetrical, trachea midline, no adenopathy; thyroid: no enlargement/tenderness/nodules; no carotid bruit or JVD  Back:     Symmetric, no curvature, ROM normal, no CVA tenderness  Lungs:     Clear to auscultation bilaterally, respirations unlabored  Chest Wall:    No tenderness or deformity   Heart:    Regular rate and rhythm, S1 and S2 normal, no murmur, rub or gallop  Breast Exam:    No tenderness, masses, or  nipple abnormality  Abdomen:     Soft, non-tender, bowel sounds active all four quadrants, no masses, no organomegaly.  FH 13.  FHT 163  bpm.  Genitalia:    Pelvic:external genitalia normal, vagina without lesions, discharge, or tenderness, rectovaginal septum  normal. Cervix normal in appearance, no cervical motion tenderness, no adnexal masses or tenderness.  Pregnancy positive findings: uterine enlargement: 13 wk size, nontender.   Rectal:    Normal external sphincter.  No hemorrhoids appreciated. Internal exam not done.   Extremities:   Extremities normal, atraumatic, no cyanosis or edema  Pulses:   2+ and symmetric all extremities  Skin:   Skin color, texture, turgor normal, no rashes or lesions  Lymph nodes:   Cervical, supraclavicular, and axillary nodes normal  Neurologic:   CNII-XII intact, normal strength, sensation and reflexes throughout     Assessment:    Pregnancy at 13 and 0/7 weeks   H/o migraines H/o GBS bacteriuria Nausea of pregnancy Rh negative status  Plan:    Initial labs reviewed.  H/o GBS bacteriuria (50K-100K) on NOB labs. Treated.  Pap smear performed today.  Prenatal vitamins encouraged. Problem list reviewed and updated. Currently notes no recent h/o migraines.  Will monitor during pregnancy.  Nausea of pregnancy, currently managing with dietary modification.  Rh negative status, will need Rhogam at 28 weeks.  New OB counseling:  The patient has been given an overview regarding routine prenatal care.  Recommendations regarding diet, weight gain, and exercise in pregnancy were given. Prenatal testing, optional genetic testing, and ultrasound use in pregnancy were reviewed.  AFP3 and cell-free DNA testing discussed: undecided.  Given info.  Benefits of Breast Feeding were discussed. The patient is encouraged to consider nursing her baby post partum. Follow up in 4 weeks.  50% of 30 min visit spent on counseling and coordination of care.      Hildred LaserAnika  Issabella Rix, MD [facility]

## 2016-01-05 NOTE — Patient Instructions (Signed)

## 2016-01-07 ENCOUNTER — Encounter: Payer: Self-pay | Admitting: Obstetrics and Gynecology

## 2016-01-07 DIAGNOSIS — E66811 Obesity, class 1: Secondary | ICD-10-CM | POA: Insufficient documentation

## 2016-01-07 DIAGNOSIS — Z283 Underimmunization status: Secondary | ICD-10-CM

## 2016-01-07 DIAGNOSIS — Z8669 Personal history of other diseases of the nervous system and sense organs: Secondary | ICD-10-CM | POA: Insufficient documentation

## 2016-01-07 DIAGNOSIS — Z2839 Other underimmunization status: Secondary | ICD-10-CM | POA: Insufficient documentation

## 2016-01-07 DIAGNOSIS — E663 Overweight: Secondary | ICD-10-CM | POA: Insufficient documentation

## 2016-01-07 DIAGNOSIS — O09899 Supervision of other high risk pregnancies, unspecified trimester: Secondary | ICD-10-CM | POA: Insufficient documentation

## 2016-01-07 DIAGNOSIS — E669 Obesity, unspecified: Secondary | ICD-10-CM | POA: Insufficient documentation

## 2016-01-10 LAB — PAP IG, CT-NG, RFX HPV ASCU
CHLAMYDIA, NUC. ACID AMP: NEGATIVE
GONOCOCCUS BY NUCLEIC ACID AMP: NEGATIVE
PAP Smear Comment: 0

## 2016-01-11 ENCOUNTER — Telehealth: Payer: Self-pay

## 2016-01-11 NOTE — Telephone Encounter (Signed)
-----   Message from Hildred LaserAnika Cherry, MD sent at 01/10/2016  7:20 PM EDT ----- Please inform of normal pap smear, Mychart sign up

## 2016-01-11 NOTE — Telephone Encounter (Signed)
Called pt no answer. Lm for pt informing her of information below.

## 2016-02-02 ENCOUNTER — Ambulatory Visit (INDEPENDENT_AMBULATORY_CARE_PROVIDER_SITE_OTHER): Payer: Managed Care, Other (non HMO) | Admitting: Obstetrics and Gynecology

## 2016-02-02 VITALS — BP 99/55 | HR 89 | Wt 176.3 lb

## 2016-02-02 DIAGNOSIS — R8271 Bacteriuria: Secondary | ICD-10-CM

## 2016-02-02 DIAGNOSIS — Z23 Encounter for immunization: Secondary | ICD-10-CM | POA: Diagnosis not present

## 2016-02-02 DIAGNOSIS — Z3402 Encounter for supervision of normal first pregnancy, second trimester: Secondary | ICD-10-CM | POA: Diagnosis not present

## 2016-02-02 LAB — POCT URINALYSIS DIPSTICK
Bilirubin, UA: NEGATIVE
Glucose, UA: NEGATIVE
KETONES UA: NEGATIVE
LEUKOCYTES UA: NEGATIVE
Nitrite, UA: NEGATIVE
PH UA: 6
PROTEIN UA: NEGATIVE
Spec Grav, UA: 1.02
UROBILINOGEN UA: NEGATIVE

## 2016-02-02 NOTE — Progress Notes (Signed)
ROB: Doing well.  Now declines genetic testing.  For flu vaccine today. For f/u urine Cx for GBS bacteriuria. RTC in 4 weeks. For anatomy scan in 3-4 weeks.

## 2016-02-04 LAB — URINE CULTURE

## 2016-02-23 ENCOUNTER — Ambulatory Visit (INDEPENDENT_AMBULATORY_CARE_PROVIDER_SITE_OTHER): Payer: Managed Care, Other (non HMO)

## 2016-02-23 DIAGNOSIS — Z3402 Encounter for supervision of normal first pregnancy, second trimester: Secondary | ICD-10-CM | POA: Diagnosis not present

## 2016-03-08 ENCOUNTER — Ambulatory Visit (INDEPENDENT_AMBULATORY_CARE_PROVIDER_SITE_OTHER): Payer: Managed Care, Other (non HMO) | Admitting: Obstetrics and Gynecology

## 2016-03-08 VITALS — BP 99/58 | HR 80 | Wt 187.1 lb

## 2016-03-08 DIAGNOSIS — Z3402 Encounter for supervision of normal first pregnancy, second trimester: Secondary | ICD-10-CM

## 2016-03-08 DIAGNOSIS — O26899 Other specified pregnancy related conditions, unspecified trimester: Secondary | ICD-10-CM

## 2016-03-08 DIAGNOSIS — Z6791 Unspecified blood type, Rh negative: Secondary | ICD-10-CM | POA: Insufficient documentation

## 2016-03-08 DIAGNOSIS — O2602 Excessive weight gain in pregnancy, second trimester: Secondary | ICD-10-CM | POA: Insufficient documentation

## 2016-03-08 LAB — POCT URINALYSIS DIPSTICK
BILIRUBIN UA: NEGATIVE
Glucose, UA: NEGATIVE
Ketones, UA: NEGATIVE
LEUKOCYTES UA: NEGATIVE
NITRITE UA: NEGATIVE
PH UA: 6
Protein, UA: NEGATIVE
RBC UA: NEGATIVE
Spec Grav, UA: 1.02
UROBILINOGEN UA: NEGATIVE

## 2016-03-08 NOTE — Progress Notes (Signed)
ROB: Denies complaints. S/p normal anatomy scan. Discussed excessive weight gain in pregnancy (has gained 10 lbs since last visit, TWG 28 lbs). Discussed calorie tracking, healthy dietary options. RTC in 4 weeks.

## 2016-03-08 NOTE — Patient Instructions (Signed)
Eating Plan for Pregnant Women Introduction While you are pregnant, your body will require additional nutrition to help support your growing baby. It is recommended that you consume:  150 additional calories each day during your first trimester.  300 additional calories each day during your second trimester.  300 additional calories each day during your third trimester. Eating a healthy, well-balanced diet is very important for your health and for your baby's health. You also have a higher need for some vitamins and minerals, such as folic acid, calcium, iron, and vitamin D. What do I need to know about eating during pregnancy?  Do not try to lose weight or go on a diet during pregnancy.  Choose healthy, nutritious foods. Choose  of a sandwich with a glass of milk instead of a candy bar or a high-calorie sugar-sweetened beverage.  Limit your overall intake of foods that have "empty calories." These are foods that have little nutritional value, such as sweets, desserts, candies, sugar-sweetened beverages, and fried foods.  Eat a variety of foods, especially fruits and vegetables.  Take a prenatal vitamin to help meet the additional needs during pregnancy, specifically for folic acid, iron, calcium, and vitamin D.  Remember to stay active. Ask your health care provider for exercise recommendations that are specific to you.  Practice good food safety and cleanliness, such as washing your hands before you eat and after you prepare raw meat. This helps to prevent foodborne illnesses, such as listeriosis, that can be very dangerous for your baby. Ask your health care provider for more information about listeriosis. What does 150 extra calories look like? Healthy options for an additional 150 calories each day could be any of the following:  Plain low-fat yogurt (6-8 oz) with  cup of berries.  1 apple with 2 teaspoons of peanut butter.  Cut-up vegetables with  cup of hummus.  Low-fat  chocolate milk (8 oz or 1 cup).  1 string cheese with 1 medium orange.   of a peanut butter and jelly sandwich on whole-wheat bread (1 tsp of peanut butter). For 300 calories, you could eat two of those healthy options each day. What is a healthy amount of weight to gain? The recommended amount of weight for you to gain is based on your pre-pregnancy BMI. If your pre-pregnancy BMI was:  Less than 18 (underweight), you should gain 28-40 lb.  18-24.9 (normal), you should gain 25-35 lb.  25-29.9 (overweight), you should gain 15-25 lb.  Greater than 30 (obese), you should gain 11-20 lb. What if I am having twins or multiples? Generally, pregnant women who will be having twins or multiples may need to increase their daily calories by 300-600 calories each day. The recommended range for total weight gain is 25-54 lb, depending on your pre-pregnancy BMI. Talk with your health care provider for specific guidance about additional nutritional needs, weight gain, and exercise during your pregnancy. What foods can I eat? Grains  Any grains. Try to choose whole grains, such as whole-wheat bread, oatmeal, or brown rice. Vegetables  Any vegetables. Try to eat a variety of colors and types of vegetables to get a full range of vitamins and minerals. Remember to wash your vegetables well before eating. Fruits  Any fruits. Try to eat a variety of colors and types of fruit to get a full range of vitamins and minerals. Remember to wash your fruits well before eating. Meats and Other Protein Sources  Lean meats, including chicken, [place], fish, and lean cuts of  beef, veal, or pork. Make sure that all meats are cooked to "well done." Tofu. Tempeh. Beans. Eggs. Peanut butter and other nut butters. Seafood, such as shrimp, crab, and lobster. If you choose fish, select types that are higher in omega-3 fatty acids, including salmon, herring, mussels, trout, sardines, and pollock. Make sure that all meats are cooked  to food-safe temperatures. Dairy  Pasteurized milk and milk alternatives. Pasteurized yogurt and pasteurized cheese. Cottage cheese. Sour cream. Beverages  Water. Juices that contain 100% fruit juice or vegetable juice. Caffeine-free teas and decaffeinated coffee. Drinks that contain caffeine are okay to drink, but it is better to avoid caffeine. Keep your total caffeine intake to less than 200 mg each day (12 oz of coffee, tea, or soda) or as directed by your health care provider. Condiments  Any pasteurized condiments. Sweets and Desserts  Any sweets and desserts. Fats and Oils  Any fats and oils. The items listed above may not be a complete list of recommended foods or beverages. Contact your dietitian for more options.  What foods are not recommended? Vegetables  Unpasteurized (raw) vegetable juices. Fruits  Unpasteurized (raw) fruit juices. Meats and Other Protein Sources  Cured meats that have nitrates, such as bacon, salami, and hotdogs. Luncheon meats, bologna, or other deli meats (unless they are reheated until they are steaming hot). Refrigerated pate, meat spreads from a meat counter, smoked seafood that is found in the refrigerated section of a store. Raw fish, such as sushi or sashimi. High mercury content fish, such as tilefish, shark, swordfish, and king mackerel. Raw meats, such as tuna or beef tartare. Undercooked meats and poultry. Make sure that all meats are cooked to food-safe temperatures. Dairy  Unpasteurized (raw) milk and any foods that have raw milk in them. Soft cheeses, such as feta, queso blanco, queso fresco, Brie, Camembert cheeses, blue-veined cheeses, and Panela cheese (unless it is made with pasteurized milk, which must be stated on the label). Beverages  Alcohol. Sugar-sweetened beverages, such as sodas, teas, or energy drinks. Condiments  Homemade fermented foods and drinks, such as pickles, sauerkraut, or kombucha drinks. (Store-bought pasteurized versions  of these are okay.) Other  Salads that are made in the store, such as ham salad, chicken salad, egg salad, tuna salad, and seafood salad. The items listed above may not be a complete list of foods and beverages to avoid. Contact your dietitian for more information.  This information is not intended to replace advice given to you by your health care provider. Make sure you discuss any questions you have with your health care provider. Document Released: 01/09/2014 Document Revised: 09/02/2015 Document Reviewed: 09/09/2013  2017 Elsevier  

## 2016-04-05 ENCOUNTER — Ambulatory Visit (INDEPENDENT_AMBULATORY_CARE_PROVIDER_SITE_OTHER): Payer: Managed Care, Other (non HMO) | Admitting: Obstetrics and Gynecology

## 2016-04-05 VITALS — BP 104/65 | HR 80 | Wt 195.2 lb

## 2016-04-05 DIAGNOSIS — Z3402 Encounter for supervision of normal first pregnancy, second trimester: Secondary | ICD-10-CM

## 2016-04-05 DIAGNOSIS — R81 Glycosuria: Secondary | ICD-10-CM

## 2016-04-05 LAB — POCT URINALYSIS DIPSTICK
BILIRUBIN UA: NEGATIVE
GLUCOSE UA: 250
KETONES UA: NEGATIVE
NITRITE UA: NEGATIVE
Protein, UA: NEGATIVE
RBC UA: NEGATIVE
Spec Grav, UA: >=1.03
Urobilinogen, UA: NEGATIVE
pH, UA: 6

## 2016-04-05 NOTE — Progress Notes (Signed)
ROB: Patient doing well, no complaints. Reiterated weight gain in pregnancy.  Patient states she did not monitor weight gain over the holidays. If still continuing to gain. Will need referral for weight management. Also with glucosuria today.  RTC in 2 weeks, for 28 week labs at that time.

## 2016-04-10 NOTE — L&D Delivery Note (Signed)
Delivery Summary for Arthor CaptainAlison K Mannings  Labor Events:   Preterm labor:   Rupture date:   Rupture time:   Rupture type: Spontaneous  Fluid Color: Clear  Induction:   Augmentation:   Complications:   Cervical ripening:          Delivery:   Episiotomy:   Lacerations: 1st degree left vaginal  Repair suture: 3-0 Vicryl  Repair # of packets: 1  Blood loss (ml): 150   Information for the patient's newborn:  Alphonsus SiasCurtis, Boy Brittania [[id number]][[id number]]    Delivery 07/05/2016 8:26 PM by  Vaginal, Spontaneous Delivery Sex:  female Gestational Age: 2234w0d Delivery Clinician:   Living?:         APGARS  One minute Five minutes Ten minutes  Skin color:        Heart rate:        Grimace:        Muscle tone:        Breathing:        Totals: 2  7      Presentation/position:      Resuscitation:   Cord information:    Disposition of cord blood:     Blood gases sent?  Complications:   Placenta: Delivered:       appearance Newborn Measurements: Weight: 9 lb 6.6 oz (4270 g)  Height: 21.85"  Head circumference:    Chest circumference:    Other providers:    Additional  information: Forceps:   Vacuum:   Breech:   Observed anomalies       Delivery Note At 8:26 PM a viable and healthy female was delivered via Vaginal, Spontaneous Delivery (Presentation: Compound (Vertex and umbilical cord); LOA position).  APGAR: 2, 7; weight 9 lb 6.6 oz (4270 g).   Placenta status: spontaneously removed, intact.  Cord: 3-vessel with the following complications: none .  Cord pH: obtained.  Anesthesia:   Episiotomy: None Lacerations: 1st degree Suture Repair: 3.0 Vicryl Est. Blood Loss (mL):  150   Mom to postpartum.  Baby to Nursery.    Hildred LaserAnika Jaycelyn Orrison, MD [facility]

## 2016-04-19 ENCOUNTER — Other Ambulatory Visit: Payer: Managed Care, Other (non HMO)

## 2016-04-19 ENCOUNTER — Ambulatory Visit (INDEPENDENT_AMBULATORY_CARE_PROVIDER_SITE_OTHER): Payer: Managed Care, Other (non HMO) | Admitting: Obstetrics and Gynecology

## 2016-04-19 VITALS — BP 110/65 | HR 88 | Wt 198.3 lb

## 2016-04-19 DIAGNOSIS — O26893 Other specified pregnancy related conditions, third trimester: Secondary | ICD-10-CM

## 2016-04-19 DIAGNOSIS — Z23 Encounter for immunization: Secondary | ICD-10-CM

## 2016-04-19 DIAGNOSIS — R81 Glycosuria: Secondary | ICD-10-CM

## 2016-04-19 DIAGNOSIS — O09893 Supervision of other high risk pregnancies, third trimester: Secondary | ICD-10-CM

## 2016-04-19 DIAGNOSIS — Z3402 Encounter for supervision of normal first pregnancy, second trimester: Secondary | ICD-10-CM

## 2016-04-19 DIAGNOSIS — Z131 Encounter for screening for diabetes mellitus: Secondary | ICD-10-CM

## 2016-04-19 DIAGNOSIS — O2602 Excessive weight gain in pregnancy, second trimester: Secondary | ICD-10-CM

## 2016-04-19 DIAGNOSIS — Z6791 Unspecified blood type, Rh negative: Secondary | ICD-10-CM

## 2016-04-19 DIAGNOSIS — Z130 Encounter for screening for diseases of the blood and blood-forming organs and certain disorders involving the immune mechanism: Secondary | ICD-10-CM

## 2016-04-19 LAB — POCT URINALYSIS DIPSTICK
BILIRUBIN UA: NEGATIVE
Glucose, UA: NEGATIVE
KETONES UA: NEGATIVE
Nitrite, UA: NEGATIVE
Protein, UA: NEGATIVE
RBC UA: NEGATIVE
SPEC GRAV UA: 1.01
Urobilinogen, UA: NEGATIVE
pH, UA: 5

## 2016-04-19 MED ORDER — TETANUS-DIPHTH-ACELL PERTUSSIS 5-2.5-18.5 LF-MCG/0.5 IM SUSP
0.5000 mL | Freq: Once | INTRAMUSCULAR | Status: AC
  Administered 2016-04-19: 0.5 mL via INTRAMUSCULAR

## 2016-04-19 MED ORDER — RHO D IMMUNE GLOBULIN 1500 UNITS IM SOSY
1500.0000 [IU] | PREFILLED_SYRINGE | Freq: Once | INTRAMUSCULAR | Status: AC
  Administered 2016-04-19: 1500 [IU] via INTRAMUSCULAR

## 2016-04-19 NOTE — Progress Notes (Signed)
ROB: Denies complaints. Doing well. Patient notes working on diet, has slowed down weight gain, but still at max recommended TWG.  Will refer to dietician.  For 28 week labs today.  Desires to breast and bottle feed,  Desires condoms/family planning method for contraception. For Tdap and Rhogam today, signed blood consent, discussed cord blood banking. RTC in 2 weeks.

## 2016-04-20 LAB — CBC
Hematocrit: 33.7 % — ABNORMAL LOW (ref 34.0–46.6)
Hemoglobin: 11.9 g/dL (ref 11.1–15.9)
MCH: 29.8 pg (ref 26.6–33.0)
MCHC: 35.3 g/dL (ref 31.5–35.7)
MCV: 84 fL (ref 79–97)
Platelets: 354 10*3/uL (ref 150–379)
RBC: 4 x10E6/uL (ref 3.77–5.28)
RDW: 12.8 % (ref 12.3–15.4)
WBC: 9.6 10*3/uL (ref 3.4–10.8)

## 2016-04-20 LAB — GLUCOSE, 1 HOUR GESTATIONAL: GESTATIONAL DIABETES SCREEN: 139 mg/dL (ref 65–139)

## 2016-05-10 ENCOUNTER — Ambulatory Visit (INDEPENDENT_AMBULATORY_CARE_PROVIDER_SITE_OTHER): Payer: Managed Care, Other (non HMO) | Admitting: Obstetrics and Gynecology

## 2016-05-10 VITALS — BP 118/69 | HR 96 | Wt 204.0 lb

## 2016-05-10 DIAGNOSIS — Z3403 Encounter for supervision of normal first pregnancy, third trimester: Secondary | ICD-10-CM

## 2016-05-10 LAB — POCT URINALYSIS DIPSTICK
BILIRUBIN UA: NEGATIVE
Blood, UA: NEGATIVE
GLUCOSE UA: NEGATIVE
Ketones, UA: NEGATIVE
NITRITE UA: NEGATIVE
PH UA: 6
Protein, UA: NEGATIVE
Spec Grav, UA: 1.02
UROBILINOGEN UA: NEGATIVE

## 2016-05-10 NOTE — Progress Notes (Signed)
ROB: Patient doing well, no complaints. Discussed circumcision for female infant. Discussed Pediatricians. RTC in 2 weeks.

## 2016-05-24 ENCOUNTER — Ambulatory Visit (INDEPENDENT_AMBULATORY_CARE_PROVIDER_SITE_OTHER): Payer: Managed Care, Other (non HMO) | Admitting: Obstetrics and Gynecology

## 2016-05-24 VITALS — BP 97/65 | HR 89 | Wt 208.0 lb

## 2016-05-24 DIAGNOSIS — G5600 Carpal tunnel syndrome, unspecified upper limb: Secondary | ICD-10-CM

## 2016-05-24 DIAGNOSIS — Z3403 Encounter for supervision of normal first pregnancy, third trimester: Secondary | ICD-10-CM

## 2016-05-24 DIAGNOSIS — O26899 Other specified pregnancy related conditions, unspecified trimester: Secondary | ICD-10-CM

## 2016-05-24 LAB — POCT URINALYSIS DIPSTICK
Bilirubin, UA: NEGATIVE
Glucose, UA: NEGATIVE
KETONES UA: NEGATIVE
Nitrite, UA: NEGATIVE
PROTEIN UA: NEGATIVE
RBC UA: NEGATIVE
Spec Grav, UA: 1.025
Urobilinogen, UA: NEGATIVE
pH, UA: 6

## 2016-05-24 NOTE — Progress Notes (Signed)
ROB: Patient c/o numbness tingling in hands in evening and when waking. Discussed increased fluid in hands and feet, can cause carpal tunnel like symptoms. Advised on hand brace, stress balls as needed.  RTC in 2 weeks.

## 2016-06-07 ENCOUNTER — Ambulatory Visit (INDEPENDENT_AMBULATORY_CARE_PROVIDER_SITE_OTHER): Payer: Managed Care, Other (non HMO) | Admitting: Obstetrics and Gynecology

## 2016-06-07 ENCOUNTER — Encounter: Payer: Self-pay | Admitting: Obstetrics and Gynecology

## 2016-06-07 VITALS — BP 104/66 | HR 93 | Wt 207.4 lb

## 2016-06-07 DIAGNOSIS — Z3493 Encounter for supervision of normal pregnancy, unspecified, third trimester: Secondary | ICD-10-CM

## 2016-06-07 LAB — POCT URINALYSIS DIPSTICK
Bilirubin, UA: NEGATIVE
Blood, UA: NEGATIVE
GLUCOSE UA: NEGATIVE
KETONES UA: NEGATIVE
LEUKOCYTES UA: NEGATIVE
Nitrite, UA: NEGATIVE
PROTEIN UA: NEGATIVE
Spec Grav, UA: 1.025
Urobilinogen, UA: NEGATIVE
pH, UA: 5

## 2016-06-07 NOTE — Patient Instructions (Addendum)
Third Trimester of Pregnancy The third trimester is from week 28 through week 40 (months 7 through 9). The third trimester is a time when the unborn baby (fetus) is growing rapidly. At the end of the ninth month, the fetus is about 20 inches in length and weighs 6-10 pounds. Body changes during your third trimester Your body will continue to go through many changes during pregnancy. The changes vary from woman to woman. During the third trimester:  Your weight will continue to increase. You can expect to gain 25-35 pounds (11-16 kg) by the end of the pregnancy.  You may begin to get stretch marks on your hips, abdomen, and breasts.  You may urinate more often because the fetus is moving lower into your pelvis and pressing on your bladder.  You may develop or continue to have heartburn. This is caused by increased hormones that slow down muscles in the digestive tract.  You may develop or continue to have constipation because increased hormones slow digestion and cause the muscles that push waste through your intestines to relax.  You may develop hemorrhoids. These are swollen veins (varicose veins) in the rectum that can itch or be painful.  You may develop swollen, bulging veins (varicose veins) in your legs.  You may have increased body aches in the pelvis, back, or thighs. This is due to weight gain and increased hormones that are relaxing your joints.  You may have changes in your hair. These can include thickening of your hair, rapid growth, and changes in texture. Some women also have hair loss during or after pregnancy, or hair that feels dry or thin. Your hair will most likely return to normal after your baby is born.  Your breasts will continue to grow and they will continue to become tender. A yellow fluid (colostrum) may leak from your breasts. This is the first milk you are producing for your baby.  Your belly button may stick out.  You may notice more swelling in your hands,  face, or ankles.  You may have increased tingling or numbness in your hands, arms, and legs. The skin on your belly may also feel numb.  You may feel short of breath because of your expanding uterus.  You may have more problems sleeping. This can be caused by the size of your belly, increased need to urinate, and an increase in your body's metabolism.  You may notice the fetus "dropping," or moving lower in your abdomen (lightening).  You may have increased vaginal discharge.  You may notice your joints feel loose and you may have pain around your pelvic bone.  What to expect at prenatal visits You will have prenatal exams every 2 weeks until week 36. Then you will have weekly prenatal exams. During a routine prenatal visit:  You will be weighed to make sure you and the baby are growing normally.  Your blood pressure will be taken.  Your abdomen will be measured to track your baby's growth.  The fetal heartbeat will be listened to.  Any test results from the previous visit will be discussed.  You may have a cervical check near your due date to see if your cervix has softened or thinned (effaced).  You will be tested for Group B streptococcus. This happens between 35 and 37 weeks.  Your health care provider may ask you:  What your birth plan is.  How you are feeling.  If you are feeling the baby move.  If you have had   any abnormal symptoms, such as leaking fluid, bleeding, severe headaches, or abdominal cramping.  If you are using any tobacco products, including cigarettes, chewing tobacco, and electronic cigarettes.  If you have any questions.  Other tests or screenings that may be performed during your third trimester include:  Blood tests that check for low iron levels (anemia).  Fetal testing to check the health, activity level, and growth of the fetus. Testing is done if you have certain medical conditions or if there are problems during the  pregnancy.  Nonstress test (NST). This test checks the health of your baby to make sure there are no signs of problems, such as the baby not getting enough oxygen. During this test, a belt is placed around your belly. The baby is made to move, and its heart rate is monitored during movement.  What is false labor? False labor is a condition in which you feel small, irregular tightenings of the muscles in the womb (contractions) that usually go away with rest, changing position, or drinking water. These are called Braxton Hicks contractions. Contractions may last for hours, days, or even weeks before true labor sets in. If contractions come at regular intervals, become more frequent, increase in intensity, or become painful, you should see your health care provider. What are the signs of labor?  Abdominal cramps.  Regular contractions that start at 10 minutes apart and become stronger and more frequent with time.  Contractions that start on the top of the uterus and spread down to the lower abdomen and back.  Increased pelvic pressure and dull back pain.  A watery or bloody mucus discharge that comes from the vagina.  Leaking of amniotic fluid. This is also known as your "water breaking." It could be a slow trickle or a gush. Let your health care provider know if it has a color or strange odor. If you have any of these signs, call your health care provider right away, even if it is before your due date. Follow these instructions at home: Medicines  Follow your health care provider's instructions regarding medicine use. Specific medicines may be either safe or unsafe to take during pregnancy.  Take a prenatal vitamin that contains at least 600 micrograms (mcg) of folic acid.  If you develop constipation, try taking a stool softener if your health care provider approves. Eating and drinking  Eat a balanced diet that includes fresh fruits and vegetables, whole grains, good sources of protein  such as meat, eggs, or tofu, and low-fat dairy. Your health care provider will help you determine the amount of weight gain that is right for you.  Avoid raw meat and uncooked cheese. These carry germs that can cause birth defects in the baby.  If you have low calcium intake from food, talk to your health care provider about whether you should take a daily calcium supplement.  Eat four or five small meals rather than three large meals a day.  Limit foods that are high in fat and processed sugars, such as fried and sweet foods.  To prevent constipation: ? Drink enough fluid to keep your urine clear or pale yellow. ? Eat foods that are high in fiber, such as fresh fruits and vegetables, whole grains, and beans. Activity  Exercise only as directed by your health care provider. Most women can continue their usual exercise routine during pregnancy. Try to exercise for 30 minutes at least 5 days a week. Stop exercising if you experience uterine contractions.  Avoid heavy   lifting.  Do not exercise in extreme heat or humidity, or at high altitudes.  Wear low-heel, comfortable shoes.  Practice good posture.  You may continue to have sex unless your health care provider tells you otherwise. Relieving pain and discomfort  Take frequent breaks and rest with your legs elevated if you have leg cramps or low back pain.  Take warm sitz baths to soothe any pain or discomfort caused by hemorrhoids. Use hemorrhoid cream if your health care provider approves.  Wear a good support bra to prevent discomfort from breast tenderness.  If you develop varicose veins: ? Wear support pantyhose or compression stockings as told by your healthcare provider. ? Elevate your feet for 15 minutes, 3-4 times a day. Prenatal care  Write down your questions. Take them to your prenatal visits.  Keep all your prenatal visits as told by your health care provider. This is important. Safety  Wear your seat belt at  all times when driving.  Make a list of emergency phone numbers, including numbers for family, friends, the hospital, and police and fire departments. General instructions  Avoid cat litter boxes and soil used by cats. These carry germs that can cause birth defects in the baby. If you have a cat, ask someone to clean the litter box for you.  Do not travel far distances unless it is absolutely necessary and only with the approval of your health care provider.  Do not use hot tubs, steam rooms, or saunas.  Do not drink alcohol.  Do not use any products that contain nicotine or tobacco, such as cigarettes and e-cigarettes. If you need help quitting, ask your health care provider.  Do not use any medicinal herbs or unprescribed drugs. These chemicals affect the formation and growth of the baby.  Do not douche or use tampons or scented sanitary pads.  Do not cross your legs for long periods of time.  To prepare for the arrival of your baby: ? Take prenatal classes to understand, practice, and ask questions about labor and delivery. ? Make a trial run to the hospital. ? Visit the hospital and tour the maternity area. ? Arrange for maternity or paternity leave through employers. ? Arrange for family and friends to take care of pets while you are in the hospital. ? Purchase a rear-facing car seat and make sure you know how to install it in your car. ? Pack your hospital bag. ? Prepare the baby's nursery. Make sure to remove all pillows and stuffed animals from the baby's crib to prevent suffocation.  Visit your dentist if you have not gone during your pregnancy. Use a soft toothbrush to brush your teeth and be gentle when you floss. Contact a health care provider if:  You are unsure if you are in labor or if your water has broken.  You become dizzy.  You have mild pelvic cramps, pelvic pressure, or nagging pain in your abdominal area.  You have lower back pain.  You have persistent  nausea, vomiting, or diarrhea.  You have an unusual or bad smelling vaginal discharge.  You have pain when you urinate. Get help right away if:  Your water breaks before 37 weeks.  You have regular contractions less than 5 minutes apart before 37 weeks.  You have a fever.  You are leaking fluid from your vagina.  You have spotting or bleeding from your vagina.  You have severe abdominal pain or cramping.  You have rapid weight loss or weight gain.    You have shortness of breath with chest pain.  You notice sudden or extreme swelling of your face, hands, ankles, feet, or legs.  Your baby makes fewer than 10 movements in 2 hours.  You have severe headaches that do not go away when you take medicine.  You have vision changes. Summary  The third trimester is from week 28 through week 40, months 7 through 9. The third trimester is a time when the unborn baby (fetus) is growing rapidly.  During the third trimester, your discomfort may increase as you and your baby continue to gain weight. You may have abdominal, leg, and back pain, sleeping problems, and an increased need to urinate.  During the third trimester your breasts will keep growing and they will continue to become tender. A yellow fluid (colostrum) may leak from your breasts. This is the first milk you are producing for your baby.  False labor is a condition in which you feel small, irregular tightenings of the muscles in the womb (contractions) that eventually go away. These are called Braxton Hicks contractions. Contractions may last for hours, days, or even weeks before true labor sets in.  Signs of labor can include: abdominal cramps; regular contractions that start at 10 minutes apart and become stronger and more frequent with time; watery or bloody mucus discharge that comes from the vagina; increased pelvic pressure and dull back pain; and leaking of amniotic fluid. This information is not intended to replace advice  given to you by your health care provider. Make sure you discuss any questions you have with your health care provider. Document Released: 03/21/2001 Document Revised: 09/02/2015 Document Reviewed: 05/28/2012 Elsevier Interactive Patient Education  2017 [employer]  

## 2016-06-07 NOTE — Progress Notes (Signed)
Mild carpal tunnel type symptoms-otherwise no issues. Patient has not tried wrist splints.

## 2016-06-08 DIAGNOSIS — Z8759 Personal history of other complications of pregnancy, childbirth and the puerperium: Secondary | ICD-10-CM

## 2016-06-08 HISTORY — DX: Personal history of other complications of pregnancy, childbirth and the puerperium: Z87.59

## 2016-06-14 ENCOUNTER — Ambulatory Visit (INDEPENDENT_AMBULATORY_CARE_PROVIDER_SITE_OTHER): Payer: 59 | Admitting: Obstetrics and Gynecology

## 2016-06-14 VITALS — BP 101/66 | HR 110 | Wt 214.0 lb

## 2016-06-14 DIAGNOSIS — Z113 Encounter for screening for infections with a predominantly sexual mode of transmission: Secondary | ICD-10-CM

## 2016-06-14 DIAGNOSIS — O2603 Excessive weight gain in pregnancy, third trimester: Secondary | ICD-10-CM

## 2016-06-14 DIAGNOSIS — Z3403 Encounter for supervision of normal first pregnancy, third trimester: Secondary | ICD-10-CM

## 2016-06-14 DIAGNOSIS — Z3685 Encounter for antenatal screening for Streptococcus B: Secondary | ICD-10-CM

## 2016-06-14 LAB — POCT URINALYSIS DIPSTICK
Bilirubin, UA: NEGATIVE
Glucose, UA: NEGATIVE
KETONES UA: NEGATIVE
NITRITE UA: NEGATIVE
PH UA: 6
PROTEIN UA: NEGATIVE
Spec Grav, UA: 1.02
Urobilinogen, UA: NEGATIVE

## 2016-06-14 LAB — OB RESULTS CONSOLE GBS: GBS: POSITIVE

## 2016-06-14 NOTE — Progress Notes (Signed)
ROB: Patient denies complaints. 36 week labs done today. Strongly reiterated weight management in pregnancy (should aim for no further weight gain). Discussed labor precautions. RTC in 1 week.

## 2016-06-16 LAB — GC/CHLAMYDIA PROBE AMP
Chlamydia trachomatis, NAA: NEGATIVE
Neisseria gonorrhoeae by PCR: NEGATIVE

## 2016-06-17 LAB — CULTURE, BETA STREP (GROUP B ONLY): Strep Gp B Culture: POSITIVE — AB

## 2016-06-21 ENCOUNTER — Ambulatory Visit (INDEPENDENT_AMBULATORY_CARE_PROVIDER_SITE_OTHER): Payer: 59 | Admitting: Obstetrics and Gynecology

## 2016-06-21 VITALS — BP 111/82 | HR 84 | Wt 214.8 lb

## 2016-06-21 DIAGNOSIS — Z3403 Encounter for supervision of normal first pregnancy, third trimester: Secondary | ICD-10-CM

## 2016-06-21 DIAGNOSIS — O99820 Streptococcus B carrier state complicating pregnancy: Secondary | ICD-10-CM

## 2016-06-21 DIAGNOSIS — O479 False labor, unspecified: Secondary | ICD-10-CM

## 2016-06-21 LAB — POCT URINALYSIS DIPSTICK
Bilirubin, UA: NEGATIVE
Glucose, UA: NEGATIVE
KETONES UA: NEGATIVE
Nitrite, UA: NEGATIVE
PH UA: 6.5
PROTEIN UA: NEGATIVE
RBC UA: NEGATIVE
Spec Grav, UA: 1.01
Urobilinogen, UA: 0.2

## 2016-06-21 NOTE — Progress Notes (Signed)
ROB: Patient notes complaints of irregular ctx, beginning last night, subsided partially since around 2 a.m.  Still noticing occasional ctx.  Cervical exam with external os 2 cm dilated, but internal os closed. Given labor precautions. GBS+, discussed need for abx in labor.  RTC in 1 week.

## 2016-06-28 ENCOUNTER — Ambulatory Visit (INDEPENDENT_AMBULATORY_CARE_PROVIDER_SITE_OTHER): Payer: 59 | Admitting: Obstetrics and Gynecology

## 2016-06-28 VITALS — BP 118/82 | HR 101 | Wt 217.8 lb

## 2016-06-28 DIAGNOSIS — Z3403 Encounter for supervision of normal first pregnancy, third trimester: Secondary | ICD-10-CM

## 2016-06-28 DIAGNOSIS — E663 Overweight: Secondary | ICD-10-CM

## 2016-06-28 LAB — POCT URINALYSIS DIPSTICK
Bilirubin, UA: NEGATIVE
Glucose, UA: NEGATIVE
Ketones, UA: NEGATIVE
Nitrite, UA: NEGATIVE
PH UA: 6 (ref 5.0–8.0)
Spec Grav, UA: 1.02 (ref 1.030–1.035)
Urobilinogen, UA: NEGATIVE (ref ?–2.0)

## 2016-06-28 NOTE — Progress Notes (Signed)
Doing well, no complaints.  Reiterated labor precautions. Declines vaginal exam today.

## 2016-07-02 ENCOUNTER — Other Ambulatory Visit: Payer: Self-pay | Admitting: Obstetrics and Gynecology

## 2016-07-02 ENCOUNTER — Telehealth: Payer: Self-pay | Admitting: Obstetrics and Gynecology

## 2016-07-02 ENCOUNTER — Observation Stay (HOSPITAL_BASED_OUTPATIENT_CLINIC_OR_DEPARTMENT_OTHER)
Admission: EM | Admit: 2016-07-02 | Discharge: 2016-07-02 | Disposition: A | Discharge status: Home / Self Care | Payer: 59 | Source: Home / Self Care | Admitting: Obstetrics and Gynecology

## 2016-07-02 ENCOUNTER — Encounter: Payer: Self-pay | Admitting: *Deleted

## 2016-07-02 DIAGNOSIS — Z3A38 38 weeks gestation of pregnancy: Secondary | ICD-10-CM

## 2016-07-02 DIAGNOSIS — O1403 Mild to moderate pre-eclampsia, third trimester: Secondary | ICD-10-CM | POA: Insufficient documentation

## 2016-07-02 LAB — COMPREHENSIVE METABOLIC PANEL
ALK PHOS: 216 U/L — AB (ref 38–126)
ALT: 7 U/L — AB (ref 14–54)
AST: 22 U/L (ref 15–41)
Albumin: 2.7 g/dL — ABNORMAL LOW (ref 3.5–5.0)
Anion gap: 8 (ref 5–15)
BILIRUBIN TOTAL: 1 mg/dL (ref 0.3–1.2)
BUN: 12 mg/dL (ref 6–20)
CO2: 18 mmol/L — AB (ref 22–32)
CREATININE: 0.68 mg/dL (ref 0.44–1.00)
Calcium: 8.6 mg/dL — ABNORMAL LOW (ref 8.9–10.3)
Chloride: 108 mmol/L (ref 101–111)
GFR calc non Af Amer: >60 mL/min (ref >60)
GLUCOSE: 96 mg/dL (ref 65–99)
Potassium: 4 mmol/L (ref 3.5–5.1)
SODIUM: 134 mmol/L — AB (ref 135–145)
Total Protein: 5.7 g/dL — ABNORMAL LOW (ref 6.5–8.1)

## 2016-07-02 LAB — CBC
HEMATOCRIT: 34.3 % — AB (ref 35.0–47.0)
HEMOGLOBIN: 11.5 g/dL — AB (ref 12.0–16.0)
MCH: 28 pg (ref 26.0–34.0)
MCHC: 33.5 g/dL (ref 32.0–36.0)
MCV: 83.6 fL (ref 80.0–100.0)
Platelets: 438 10*3/uL (ref 150–440)
RBC: 4.1 MIL/uL (ref 3.80–5.20)
RDW: 14.3 % (ref 11.5–14.5)
WBC: 12 10*3/uL — ABNORMAL HIGH (ref 3.6–11.0)

## 2016-07-02 LAB — URINALYSIS, ROUTINE W REFLEX MICROSCOPIC
Bilirubin Urine: NEGATIVE
Glucose, UA: 150 mg/dL — AB
Hgb urine dipstick: NEGATIVE
Ketones, ur: NEGATIVE mg/dL
LEUKOCYTES UA: NEGATIVE
NITRITE: NEGATIVE
Protein, ur: NEGATIVE mg/dL
SPECIFIC GRAVITY, URINE: 1.013 (ref 1.005–1.030)
pH: 5 (ref 5.0–8.0)

## 2016-07-02 LAB — PROTEIN / CREATININE RATIO, URINE
CREATININE, URINE: 82 mg/dL
Protein Creatinine Ratio: 0.33 mg/mg{Cre} — ABNORMAL HIGH (ref 0.00–0.15)
Total Protein, Urine: 27 mg/dL

## 2016-07-02 NOTE — Telephone Encounter (Signed)
[facility]) as patient complains of elevated BP and swelling. She is a 26 y.o. G1P0000 female at [id number] gestation. Patient notes that she took her BP at home, and was 151/92.  Notes that she has swelling in lower extremities bilaterally where she can "press it and it leaves a dent".  Patient without prior h/o blood pressure elevations.  Denies headache currently (although notes headache yesterday but has resolved), blurred vision, RUQ pain.  Advised patient to recheck BP after an hour, and if still elevated, she should f/u in L&D triage for further evaluation.  Patient notes understanding.    Hildred LaserAnika Zale Marcotte, MD [facility]

## 2016-07-02 NOTE — OB Triage Note (Signed)
Discharge instructions review with patient. Braxton hicks, signs and symptoms of preeclampsia, fetal movement, LOF, bleeding, importance of hydration  discussed and acknowledged.   Patient denies any pain, bleeding, or leaking of fluid. Feel her baby move.   Understands to follow up with provider and keep all scheduled appointments.   Left ambulatory in good condition with husband and family member.

## 2016-07-02 NOTE — Final Progress Note (Signed)
L&D OB Triage Note  Jaime Mcintosh is a 26 y.o. G1P0000 female at [redacted]w[redacted]d, EDD Estimated Date of Delivery: 07/12/16 who presented to triage for complaints of elevated BPs at home (reports 150s/90s) and lower extremity swelling. Also reported headache yesterday, but none today. Patient does not have a prior h/o BP issues in current pregnancy.  She was evaluated by the nurses with findings significant for mild pitting edema (trace). Vital signs noted below. An NST was performed and has been reviewed by MD.      Vitals:   07/02/16 1823 07/02/16 1829 07/02/16 1844 07/02/16 1858  BP:  (!) 138/92 132/90 (!) 140/91  Pulse:  93 85 89  Resp:      Temp:      Weight: 217 lb 12.8 oz (98.8 kg)     Height: 5' 4.5" (1.638 m)       Vitals:   07/02/16 1914 07/02/16 1929 07/02/16 1935  BP: 135/87 (!) 136/92 137/89  Pulse: 87 85 79  Resp:     Temp:     Weight:     Height:        NST INTERPRETATION: Indications: assess fetal well being  Mode: External Baseline Rate (A): 150 bpm Variability: Moderate Accelerations: 15 x 15 Decelerations: Variable     Contraction Frequency (min): no ctx noted  Impression: reactive    Labs:  Results for orders placed or performed during the hospital encounter of 07/02/16  CBC  Result Value Ref Range   WBC 12.0 (H) 3.6 - 11.0 K/uL   RBC 4.10 3.80 - 5.20 MIL/uL   Hemoglobin 11.5 (L) 12.0 - 16.0 g/dL   HCT 16.1 (L) 09.6 - 04.5 %   MCV 83.6 80.0 - 100.0 fL   MCH 28.0 26.0 - 34.0 pg   MCHC 33.5 32.0 - 36.0 g/dL   RDW 40.9 81.1 - 91.4 %   Platelets 438 150 - 440 K/uL  Comprehensive metabolic panel  Result Value Ref Range   Sodium 134 (L) 135 - 145 mmol/L   Potassium 4.0 3.5 - 5.1 mmol/L   Chloride 108 101 - 111 mmol/L   CO2 18 (L) 22 - 32 mmol/L   Glucose, Bld 96 65 - 99 mg/dL   BUN 12 6 - 20 mg/dL   Creatinine, Ser 7.82 0.44 - 1.00 mg/dL   Calcium 8.6 (L) 8.9 - 10.3 mg/dL   Total Protein 5.7 (L) 6.5 - 8.1 g/dL   Albumin 2.7 (L) 3.5 - 5.0 g/dL   AST 22 15 - 41 U/L   ALT 7 (L) 14 - 54 U/L   Alkaline Phosphatase 216 (H) 38 - 126 U/L   Total Bilirubin 1.0 0.3 - 1.2 mg/dL   GFR calc non Af Amer >60 >60 mL/min   GFR calc Af Amer >60 >60 mL/min   Anion gap 8 5 - 15  Protein / creatinine ratio, urine  Result Value Ref Range   Creatinine, Urine 82 mg/dL   Total Protein, Urine 27 mg/dL   Protein Creatinine Ratio 0.33 (H) 0.00 - 0.15 mg/mg[Cre]  Urinalysis, Routine w reflex microscopic  Result Value Ref Range   Color, Urine YELLOW (A) YELLOW   APPearance HAZY (A) CLEAR   Specific Gravity, Urine 1.013 1.005 - 1.030   pH 5.0 5.0 - 8.0   Glucose, UA 150 (A) NEGATIVE mg/dL   Hgb urine dipstick NEGATIVE NEGATIVE   Bilirubin Urine NEGATIVE NEGATIVE   Ketones, ur NEGATIVE NEGATIVE mg/dL   Protein, ur NEGATIVE NEGATIVE  mg/dL   Nitrite NEGATIVE NEGATIVE   Leukocytes, UA NEGATIVE NEGATIVE    Plan: NST performed was reviewed and was found to be reactive. She was discharged home with new diagnosis of mild pre-eclampsia.  Will have patient f/u in clinic, and discuss likely IOL at 39 weeks. Continue routine prenatal care. Follow up with OB/GYN as previously scheduled.     Hildred LaserAnika Aiden Helzer, MD [facility]

## 2016-07-02 NOTE — OB Triage Note (Signed)
C/o elevated blood pressure and swelling. Denies HA/blurred vision/visual disturbances/epigastric pain. Jaime Mcintosh, Jaime Mcintosh

## 2016-07-03 ENCOUNTER — Telehealth: Payer: Self-pay | Admitting: Obstetrics and Gynecology

## 2016-07-03 NOTE — Telephone Encounter (Signed)
Contacted patient regarding new diagnosis of mild pre-eclampsia.  Discussed that due to diagnosis, patient should undergo IOL in the next several days.  Will schedule for IOL tomorrow evening at 8 pm.  Will also move patient's routine OB appointment up to tomorrow so that any further questions can be addressed.  Patient notes understanding. Reiterated PIH precautions.

## 2016-07-04 ENCOUNTER — Ambulatory Visit (INDEPENDENT_AMBULATORY_CARE_PROVIDER_SITE_OTHER): Payer: 59 | Admitting: Obstetrics and Gynecology

## 2016-07-04 ENCOUNTER — Inpatient Hospital Stay
Admission: EM | Admit: 2016-07-04 | Discharge: 2016-07-07 | DRG: 775 | Disposition: A | Discharge status: Home / Self Care | Payer: 59 | Attending: Obstetrics and Gynecology | Admitting: Obstetrics and Gynecology

## 2016-07-04 ENCOUNTER — Encounter: Payer: Self-pay | Admitting: Obstetrics and Gynecology

## 2016-07-04 VITALS — BP 115/76 | HR 89 | Wt 219.9 lb

## 2016-07-04 DIAGNOSIS — Z3403 Encounter for supervision of normal first pregnancy, third trimester: Secondary | ICD-10-CM

## 2016-07-04 DIAGNOSIS — O9962 Diseases of the digestive system complicating childbirth: Secondary | ICD-10-CM | POA: Diagnosis present

## 2016-07-04 DIAGNOSIS — O26893 Other specified pregnancy related conditions, third trimester: Secondary | ICD-10-CM | POA: Diagnosis present

## 2016-07-04 DIAGNOSIS — O99820 Streptococcus B carrier state complicating pregnancy: Secondary | ICD-10-CM

## 2016-07-04 DIAGNOSIS — Z6837 Body mass index (BMI) 37.0-37.9, adult: Secondary | ICD-10-CM

## 2016-07-04 DIAGNOSIS — Z6791 Unspecified blood type, Rh negative: Secondary | ICD-10-CM

## 2016-07-04 DIAGNOSIS — Z8249 Family history of ischemic heart disease and other diseases of the circulatory system: Secondary | ICD-10-CM | POA: Diagnosis not present

## 2016-07-04 DIAGNOSIS — K219 Gastro-esophageal reflux disease without esophagitis: Secondary | ICD-10-CM | POA: Diagnosis present

## 2016-07-04 DIAGNOSIS — O99214 Obesity complicating childbirth: Secondary | ICD-10-CM | POA: Diagnosis present

## 2016-07-04 DIAGNOSIS — Z23 Encounter for immunization: Secondary | ICD-10-CM | POA: Diagnosis not present

## 2016-07-04 DIAGNOSIS — O2602 Excessive weight gain in pregnancy, second trimester: Secondary | ICD-10-CM

## 2016-07-04 DIAGNOSIS — Z3A39 39 weeks gestation of pregnancy: Secondary | ICD-10-CM | POA: Diagnosis not present

## 2016-07-04 DIAGNOSIS — E669 Obesity, unspecified: Secondary | ICD-10-CM | POA: Diagnosis present

## 2016-07-04 DIAGNOSIS — O4202 Full-term premature rupture of membranes, onset of labor within 24 hours of rupture: Secondary | ICD-10-CM | POA: Diagnosis present

## 2016-07-04 DIAGNOSIS — O1403 Mild to moderate pre-eclampsia, third trimester: Secondary | ICD-10-CM | POA: Diagnosis present

## 2016-07-04 DIAGNOSIS — Z283 Underimmunization status: Secondary | ICD-10-CM

## 2016-07-04 DIAGNOSIS — O99824 Streptococcus B carrier state complicating childbirth: Secondary | ICD-10-CM | POA: Diagnosis present

## 2016-07-04 DIAGNOSIS — O09899 Supervision of other high risk pregnancies, unspecified trimester: Secondary | ICD-10-CM

## 2016-07-04 DIAGNOSIS — O1404 Mild to moderate pre-eclampsia, complicating childbirth: Secondary | ICD-10-CM | POA: Diagnosis present

## 2016-07-04 DIAGNOSIS — O26899 Other specified pregnancy related conditions, unspecified trimester: Secondary | ICD-10-CM

## 2016-07-04 LAB — ABO/RH: ABO/RH(D): O NEG

## 2016-07-04 LAB — CHLAMYDIA/NGC RT PCR (ARMC ONLY)
CHLAMYDIA TR: NOT DETECTED
N gonorrhoeae: NOT DETECTED

## 2016-07-04 LAB — CBC
HEMATOCRIT: 33.7 % — AB (ref 35.0–47.0)
HEMOGLOBIN: 11.5 g/dL — AB (ref 12.0–16.0)
MCH: 28.6 pg (ref 26.0–34.0)
MCHC: 34 g/dL (ref 32.0–36.0)
MCV: 84.1 fL (ref 80.0–100.0)
Platelets: 425 10*3/uL (ref 150–440)
RBC: 4.01 MIL/uL (ref 3.80–5.20)
RDW: 14.3 % (ref 11.5–14.5)
WBC: 11.4 10*3/uL — AB (ref 3.6–11.0)

## 2016-07-04 LAB — POCT URINALYSIS DIPSTICK
BILIRUBIN UA: NEGATIVE
Blood, UA: NEGATIVE
GLUCOSE UA: NEGATIVE
KETONES UA: NEGATIVE
Nitrite, UA: NEGATIVE
SPEC GRAV UA: 1.02 (ref 1.030–1.035)
Urobilinogen, UA: NEGATIVE (ref ?–2.0)
pH, UA: 6 (ref 5.0–8.0)

## 2016-07-04 LAB — RAPID HIV SCREEN (HIV 1/2 AB+AG)
HIV 1/2 Antibodies: NONREACTIVE
HIV-1 P24 ANTIGEN - HIV24: NONREACTIVE

## 2016-07-04 LAB — OB RESULTS CONSOLE GC/CHLAMYDIA: Chlamydia: NEGATIVE

## 2016-07-04 MED ORDER — TERBUTALINE SULFATE 1 MG/ML IJ SOLN: 0.2500 mg | Freq: Once | INTRAMUSCULAR | Status: DC | PRN

## 2016-07-04 MED ORDER — OXYTOCIN BOLUS FROM INFUSION: 500.0000 mL | Freq: Once | INTRAVENOUS | Status: DC

## 2016-07-04 MED ORDER — OXYCODONE-ACETAMINOPHEN 5-325 MG PO TABS: 2.0000 | ORAL_TABLET | ORAL | Status: DC | PRN

## 2016-07-04 MED ORDER — OXYTOCIN 40 UNITS IN LACTATED RINGERS INFUSION - SIMPLE MED: 2.5000 [IU]/h | INTRAVENOUS | Status: DC

## 2016-07-04 MED ORDER — MISOPROSTOL 25 MCG QUARTER TABLET
25.0000 ug | ORAL_TABLET | ORAL | Status: DC | PRN
  Administered 2016-07-04 – 2016-07-05 (×2): 25 ug via VAGINAL
  Filled 2016-07-04 (×2): qty 1

## 2016-07-04 MED ORDER — ONDANSETRON HCL 4 MG/2ML IJ SOLN
4.0000 mg | Freq: Four times a day (QID) | INTRAMUSCULAR | Status: DC | PRN
  Administered 2016-07-05: 4 mg via INTRAVENOUS
  Filled 2016-07-04: qty 2

## 2016-07-04 MED ORDER — SODIUM CHLORIDE 0.9 % IV SOLN
2.0000 g | Freq: Once | INTRAVENOUS | Status: AC
  Administered 2016-07-05: 2 g via INTRAVENOUS
  Filled 2016-07-04: qty 2000

## 2016-07-04 MED ORDER — SODIUM CHLORIDE 0.9 % IV SOLN
1.0000 g | INTRAVENOUS | Status: DC
  Administered 2016-07-05: 1 g via INTRAVENOUS
  Filled 2016-07-04: qty 1000

## 2016-07-04 MED ORDER — OXYCODONE-ACETAMINOPHEN 5-325 MG PO TABS: 1.0000 | ORAL_TABLET | ORAL | Status: DC | PRN

## 2016-07-04 MED ORDER — BUTORPHANOL TARTRATE 1 MG/ML IJ SOLN
1.0000 mg | INTRAMUSCULAR | Status: DC | PRN
  Administered 2016-07-05: 1 mg via INTRAVENOUS
  Filled 2016-07-04 (×2): qty 1

## 2016-07-04 MED ORDER — LACTATED RINGERS IV SOLN
500.0000 mL | INTRAVENOUS | Status: DC | PRN
  Administered 2016-07-05 (×2): 500 mL via INTRAVENOUS

## 2016-07-04 MED ORDER — LACTATED RINGERS IV SOLN
INTRAVENOUS | Status: DC
  Administered 2016-07-04 – 2016-07-05 (×3): via INTRAVENOUS

## 2016-07-04 MED ORDER — ACETAMINOPHEN 325 MG PO TABS: 650.0000 mg | ORAL_TABLET | ORAL | Status: DC | PRN

## 2016-07-04 MED ORDER — LIDOCAINE HCL (PF) 1 % IJ SOLN: 30.0000 mL | INTRAMUSCULAR | Status: DC | PRN

## 2016-07-04 MED ORDER — SOD CITRATE-CITRIC ACID 500-334 MG/5ML PO SOLN: 30.0000 mL | ORAL | Status: DC | PRN

## 2016-07-04 NOTE — Progress Notes (Signed)
ROB: Patient with newly diagnosed mild pre-eclampsia diagnosed in triage 2 days ago.  Discussed IOL process, further discussed pre-eclampsia.  Scheduled for IOL this evening.  BPs ok today.

## 2016-07-05 ENCOUNTER — Encounter: Payer: Managed Care, Other (non HMO) | Admitting: Obstetrics and Gynecology

## 2016-07-05 ENCOUNTER — Inpatient Hospital Stay: Payer: 59 | Admitting: Anesthesiology

## 2016-07-05 ENCOUNTER — Encounter: Payer: Self-pay | Admitting: Obstetrics and Gynecology

## 2016-07-05 DIAGNOSIS — Z3A39 39 weeks gestation of pregnancy: Secondary | ICD-10-CM

## 2016-07-05 DIAGNOSIS — O1404 Mild to moderate pre-eclampsia, complicating childbirth: Principal | ICD-10-CM

## 2016-07-05 LAB — CBC
HCT: 35.7 % (ref 35.0–47.0)
HEMOGLOBIN: 12 g/dL (ref 12.0–16.0)
MCH: 28 pg (ref 26.0–34.0)
MCHC: 33.6 g/dL (ref 32.0–36.0)
MCV: 83.4 fL (ref 80.0–100.0)
PLATELETS: 399 10*3/uL (ref 150–440)
RBC: 4.28 MIL/uL (ref 3.80–5.20)
RDW: 14.7 % — AB (ref 11.5–14.5)
WBC: 17.9 10*3/uL — ABNORMAL HIGH (ref 3.6–11.0)

## 2016-07-05 MED ORDER — NALBUPHINE HCL 10 MG/ML IJ SOLN: 5.0000 mg | Freq: Once | INTRAMUSCULAR | Status: DC | PRN

## 2016-07-05 MED ORDER — NALBUPHINE HCL 10 MG/ML IJ SOLN: 5.0000 mg | INTRAMUSCULAR | Status: DC | PRN

## 2016-07-05 MED ORDER — FENTANYL 2.5 MCG/ML W/ROPIVACAINE 0.2% IN NS 100 ML EPIDURAL INFUSION (ARMC-ANES)
10.0000 mL/h | EPIDURAL | Status: DC
  Administered 2016-07-05 (×2): 10 mL/h via EPIDURAL

## 2016-07-05 MED ORDER — OXYTOCIN 40 UNITS IN LACTATED RINGERS INFUSION - SIMPLE MED
1.0000 m[IU]/min | INTRAVENOUS | Status: DC
  Administered 2016-07-05: 2 m[IU]/min via INTRAVENOUS

## 2016-07-05 MED ORDER — FENTANYL 2.5 MCG/ML W/ROPIVACAINE 0.2% IN NS 100 ML EPIDURAL INFUSION (ARMC-ANES)
EPIDURAL | Status: AC
  Administered 2016-07-05: 10 mL/h via EPIDURAL
  Filled 2016-07-05: qty 100

## 2016-07-05 MED ORDER — SODIUM CHLORIDE 0.9% FLUSH: 3.0000 mL | INTRAVENOUS | Status: DC | PRN

## 2016-07-05 MED ORDER — NALOXONE HCL 0.4 MG/ML IJ SOLN: 0.4000 mg | INTRAMUSCULAR | Status: DC | PRN

## 2016-07-05 MED ORDER — DIPHENHYDRAMINE HCL 25 MG PO CAPS: 25.0000 mg | ORAL_CAPSULE | ORAL | Status: DC | PRN

## 2016-07-05 MED ORDER — IBUPROFEN 600 MG PO TABS
ORAL_TABLET | ORAL | Status: AC
  Administered 2016-07-05: 600 mg via ORAL
  Filled 2016-07-05: qty 1

## 2016-07-05 MED ORDER — NALOXONE HCL 2 MG/2ML IJ SOSY
1.0000 ug/kg/h | PREFILLED_SYRINGE | INTRAVENOUS | Status: DC | PRN
  Filled 2016-07-05: qty 2

## 2016-07-05 MED ORDER — LIDOCAINE HCL (PF) 1 % IJ SOLN
INTRAMUSCULAR | Status: DC | PRN
  Administered 2016-07-05: 3 mL

## 2016-07-05 MED ORDER — IBUPROFEN 600 MG PO TABS
600.0000 mg | ORAL_TABLET | Freq: Four times a day (QID) | ORAL | Status: DC
  Administered 2016-07-05 – 2016-07-07 (×7): 600 mg via ORAL
  Filled 2016-07-05 (×6): qty 1

## 2016-07-05 MED ORDER — LIDOCAINE-EPINEPHRINE (PF) 1.5 %-1:200000 IJ SOLN
INTRAMUSCULAR | Status: DC | PRN
  Administered 2016-07-05: 3 mL via PERINEURAL

## 2016-07-05 MED ORDER — DIPHENHYDRAMINE HCL 50 MG/ML IJ SOLN: 12.5000 mg | INTRAMUSCULAR | Status: DC | PRN

## 2016-07-05 MED ORDER — BUPIVACAINE HCL (PF) 0.25 % IJ SOLN
INTRAMUSCULAR | Status: DC | PRN
  Administered 2016-07-05: 10 mL via EPIDURAL
  Administered 2016-07-05: 5 mL via EPIDURAL
  Administered 2016-07-05 (×2): 4 mL via EPIDURAL
  Administered 2016-07-05: 3 mL via EPIDURAL

## 2016-07-05 NOTE — Progress Notes (Signed)
Foley catheter removed at 1955. Output . Patient tolerated well. Removed per protocol.

## 2016-07-05 NOTE — H&P (Signed)
Obstetric History and Physical  BOBETTA KORF is a 26 y.o. G1P0 with IUP at [redacted]w[redacted]d presented overnight for scheduled IOL for mild pre-eclampsia. Patient states she has been having  occasional contractions, none vaginal bleeding, intact membranes, with active fetal movement.    Prenatal Course Source of Care: [facility] with onset of care at 10 weeks Pregnancy complications or risks: Patient Active Problem List   Diagnosis Date Noted  . Gestational htn w/o significant proteinuria, third trimester 07/04/2016  . Mild pre-eclampsia in third trimester 07/02/2016  . GBS (group B Streptococcus carrier), +RV culture, currently pregnant 06/21/2016  . Rh negative state in antepartum period 03/08/2016  . Excess weight gain in pregnancy, second trimester 03/08/2016  . Maternal varicella, non-immune 01/07/2016  . History of migraine 01/07/2016  . Overweight (BMI 25.0-29.9) 01/07/2016  . GBS bacteriuria 12/20/2015   She plans to breastfeed She desires condoms for postpartum contraception.   Prenatal labs and studies: ABO, Rh: --/--/O NEG (03/27 2200) Antibody: POS (03/27 1955) Rubella: 15.70 (09/07 1020) RPR: Non Reactive (09/07 1020)  HBsAg: Negative (09/07 1020)  HIV: Non Reactive (09/07 1020)  RUE:AVWUJWJX (03/07 1016) 1 hr Glucola  139 Genetic screening declined Anatomy US normal    Past Medical History:  Diagnosis Date  . Headache    hx migraines    Past Surgical History:  Procedure Laterality Date  . NO PAST SURGERIES      OB History  Gravida Para Term Preterm AB Living  1 0 0     0  SAB TAB Ectopic Multiple Live Births          0    # Outcome Date GA Lbr Len/2nd Weight Sex Delivery Anes PTL Lv  1 Current               Social History   Social History  . Marital status: Married    Spouse name: N/A  . Number of children: N/A  . Years of education: N/A   Social History Main Topics  . Smoking status: Never Smoker  . Smokeless tobacco: Never Used   . Alcohol use No  . Drug use: No  . Sexual activity: Yes    Partners: Female   Other Topics Concern  . None   Social History Narrative   Works nights       Family History  Problem Relation Age of Onset  . Heart disease Maternal Grandfather   . Diabetes Paternal Grandfather   . Arthritis Maternal Grandmother   . Hypertension Maternal Grandmother   . Varicose Veins Maternal Grandmother   . Vision loss Paternal Grandmother   . Cancer Mother   . Stroke Neg Hx     Prescriptions Prior to Admission  Medication Sig Dispense Refill Last Dose  . Prenatal Vit-Fe Fumarate-FA (PNV PRENATAL PLUS MULTIVITAMIN) 27-1 MG TABS Take 1 tablet by mouth daily.  3 07/04/2016 at Unknown time  . cetirizine (ZYRTEC) 10 MG tablet TAKE 1 TABLET (10 MG TOTAL) BY MOUTH DAILY FOR 30 DAYS.  0 Taking  . PROAIR HFA 108 (90 Base) MCG/ACT inhaler TAKE 1-2 INHALATIONS INTO THE LUNGS EVERY 4 TO 6 HOURS AS NEEDED FOR WHEEZING  0 Taking  . Spacer/Aero-Holding Chambers (OPTICHAMBER DIAMOND) MISC TO USE WITH INHALATION DEVICE  0 Taking    No Known Allergies  Review of Systems: Negative except for what is mentioned in HPI.  Physical Exam: BP 127/89 (BP Location: Right Arm)   Pulse (!) 113   Temp 98.1  F (36.7 C) (Oral)   Resp 20   Ht 5\' 4"  (1.626 m)   Wt 219 lb (99.3 kg)   LMP 10/06/2015 (Approximate)   BMI 37.59 kg/m  CONSTITUTIONAL: Well-developed, well-nourished female in no acute distress.  HENT:  Normocephalic, atraumatic, External right and left ear normal. Oropharynx is clear and moist EYES: Conjunctivae and EOM are normal. Pupils are equal, round, and reactive to light. No scleral icterus.  NECK: Normal range of motion, supple, no masses SKIN: Skin is warm and dry. No rash noted. Not diaphoretic. No erythema. No pallor. NEUROLOGIC: Alert and oriented to person, place, and time. Normal reflexes, muscle tone coordination. No cranial nerve deficit noted. PSYCHIATRIC: Normal mood and affect. Normal  behavior. Normal judgment and thought content. CARDIOVASCULAR: Normal heart rate noted, regular rhythm RESPIRATORY: Effort and breath sounds normal, no problems with respiration noted ABDOMEN: Soft, nontender, nondistended, gravid. MUSCULOSKELETAL: Normal range of motion. No edema and no tenderness. 2+ distal pulses.  Cervical Exam: Dilatation 3 cm   Effacement 60%   Station -3 to -2 .  SROM at 5:20 a.m., clear.  Presentation: cephalic FHT:  Baseline rate 135 bpm   Variability moderate  Accelerations present   Decelerations none Contractions: Every 3 mins   Pertinent Labs/Studies:   Results for orders placed or performed during the hospital encounter of 07/04/16 (from the past 24 hour(s))  CBC     Status: Abnormal   Collection Time: 07/04/16  7:55 PM  Result Value Ref Range   WBC 11.4 (H) 3.6 - 11.0 K/uL   RBC 4.01 3.80 - 5.20 MIL/uL   Hemoglobin 11.5 (L) 12.0 - 16.0 g/dL   HCT 16.1 (L) 09.6 - 04.5 %   MCV 84.1 80.0 - 100.0 fL   MCH 28.6 26.0 - 34.0 pg   MCHC 34.0 32.0 - 36.0 g/dL   RDW 40.9 81.1 - 91.4 %   Platelets 425 150 - 440 K/uL  Type and screen     Status: None (Preliminary result)   Collection Time: 07/04/16  7:55 PM  Result Value Ref Range   ABO/RH(D) O NEG    Antibody Screen POS    Sample Expiration 07/07/2016    Antibody Identification PASSIVELY ACQUIRED ANTI-D    Unit Number [id number]    Blood Component Type RED CELLS,LR    Unit division 00    Status of Unit ALLOCATED    Transfusion Status OK TO TRANSFUSE    Crossmatch Result COMPATIBLE    Unit Number [id number]    Blood Component Type RED CELLS,LR    Unit division 00    Status of Unit ALLOCATED    Transfusion Status OK TO TRANSFUSE    Crossmatch Result COMPATIBLE   Rapid HIV screen (HIV 1/2 Ab+Ag)     Status: None   Collection Time: 07/04/16  7:55 PM  Result Value Ref Range   HIV-1 P24 Antigen - HIV24 NON REACTIVE NON REACTIVE   HIV 1/2 Antibodies NON REACTIVE NON REACTIVE   Interpretation (HIV Ag  Ab)      A non reactive test result means that HIV 1 or HIV 2 antibodies and HIV 1 p24 antigen were not detected in the specimen.  Chlamydia/NGC rt PCR ([facility] only)     Status: None   Collection Time: 07/04/16  7:56 PM  Result Value Ref Range   Specimen source GC/Chlam URINE, RANDOM    Chlamydia Tr NOT DETECTED NOT DETECTED   N gonorrhoeae NOT DETECTED NOT DETECTED  ABO/Rh  Status: None   Collection Time: 07/04/16 10:00 PM  Result Value Ref Range   ABO/RH(D) O NEG     Assessment : Arthor Captainlison K Swann is a 26 y.o. G1P0000 at 7071w0d being admitted for IOL for mild pre-eclampsia.    Plan: Labor: Induction with Cytotec, s/p 2 doses overnight with SROM at 5:20 a.m.  Will manage expectantly for now.  Augmentation as needed, per protocol.  FWB: Reassuring fetal heart tracing.  GBS positive.  Will begin prophylaxis with Ampicillin.  Delivery plan: Hopeful for vaginal delivery   Hildred LaserAnika Kristan Brummitt, MD [facility]

## 2016-07-05 NOTE — Anesthesia Procedure Notes (Signed)
Epidural Patient location during procedure: OB Start time: 07/05/2016 10:52 AM End time: 07/05/2016 11:09 AM  Staffing Anesthesiologist: Rosaria FerriesPISCITELLO, JOSEPH K Resident/CRNA: Malva CoganBEANE, Edmar Blankenburg Performed: resident/CRNA   Preanesthetic Checklist Completed: patient identified, site marked, surgical consent, pre-op evaluation, timeout performed, IV checked, risks and benefits discussed and monitors and equipment checked  Epidural Patient position: sitting Prep: Betadine Patient monitoring: heart rate, continuous pulse ox and blood pressure Approach: midline Location: L3-L4 Injection technique: LOR saline  Needle:  Needle type: Tuohy  Needle gauge: 17 G Needle length: 9 cm and 9 Needle insertion depth: 7 cm Catheter type: closed end flexible Catheter size: 19 Gauge Catheter at skin depth: 13 cm Test dose: negative and 1.5% lidocaine with Epi 1:200 K  Assessment Sensory level: T10 Events: blood not aspirated, injection not painful, no injection resistance, negative IV test and no paresthesia  Additional Notes Pt. Evaluated and documentation done after procedure finished. Patient identified. Risks/Benefits/Options discussed with patient including but not limited to bleeding, infection, nerve damage, paralysis, failed block, incomplete pain control, headache, blood pressure changes, nausea, vomiting, reactions to medication both or allergic, itching and postpartum back pain. Confirmed with bedside nurse the patient's most recent platelet count. Confirmed with patient that they are not currently taking any anticoagulation, have any bleeding history or any family history of bleeding disorders. Patient expressed understanding and wished to proceed. All questions were answered. Sterile technique was used throughout the entire procedure. Please see nursing notes for vital signs. Test dose was given through epidural catheter and negative prior to continuing to dose epidural or start infusion.  Warning signs of high block given to the patient including shortness of breath, tingling/numbness in hands, complete motor block, or any concerning symptoms with instructions to call for help. Patient was given instructions on fall risk and not to get out of bed. All questions and concerns addressed with instructions to call with any issues or inadequate analgesia.   Patient tolerated the insertion well without immediate complications.Reason for block:procedure for pain

## 2016-07-05 NOTE — Progress Notes (Signed)
Intrapartum Progress Note  S: Patient still noting moderate pain with contractions after epidural placement, currently 6/10.   O:  Vitals:   07/05/16 1108 07/05/16 1114 07/05/16 1119 07/05/16 1147  BP: 133/80 134/86 (!) 145/87 133/81  Pulse: 83 81 82 74  Resp:      Temp:      TempSrc:      Weight:      Height:       Gen App: NAD, uncomfortable with contractions Abdomen: soft, gravid FHT: baseline 145 bpm.  Accels present.  Decels absent. moderate in degree variability.   Tocometer: contractions q 1-4 minutes, irregular Cervix: 4/90/-2/SROM Extremities: Nontender, no edema.  Labs:  No new labs   Assessment:  1: SIUP at 4812w0d 2. Mild pre-eclampsia  Plan:  1. Anesthesia notified of ineffective epidural, in room to assess patient.  2. Continue to monitor BPs. Will initiate pre-eclampsia protocol if severe range BPs or symptoms occur.  3. Pitocin for augmentation per protocol.   Hildred LaserAnika Albie Bazin, MD 07/05/2016 12:29 PM

## 2016-07-05 NOTE — Anesthesia Preprocedure Evaluation (Signed)
Anesthesia Evaluation  Patient identified by MRN, date of birth, ID band Patient awake    Reviewed: Allergy & Precautions, NPO status , Patient's Chart, lab work & pertinent test results  Airway Mallampati: II  TM Distance: >3 FB Neck ROM: Full    Dental   Pulmonary           Cardiovascular hypertension,      Neuro/Psych  Headaches,    GI/Hepatic GERD  ,  Endo/Other    Renal/GU      Musculoskeletal   Abdominal   Peds  Hematology   Anesthesia Other Findings   Reproductive/Obstetrics (+) Pregnancy                             Anesthesia Physical Anesthesia Plan  ASA: II  Anesthesia Plan: Epidural   Post-op Pain Management:    Induction:   Airway Management Planned:   Additional Equipment:   Intra-op Plan:   Post-operative Plan:   Informed Consent:   Plan Discussed with: CRNA and Anesthesiologist  Anesthesia Plan Comments:         Anesthesia Quick Evaluation

## 2016-07-05 NOTE — Progress Notes (Signed)
Intrapartum Progress Note  S: Patient comfortable, denies complaints.   O:  Vitals:   07/05/16 1400 07/05/16 1454 07/05/16 1604 07/05/16 1612  BP:  126/74  134/77  Pulse:  90  (!) 108  Resp:      Temp: 98.6 F (37 C)  99.2 F (37.3 C)   TempSrc: Oral  Oral   Weight:      Height:       Gen App: NAD, comfortable with contractions Abdomen: soft, gravid FHT: baseline 145 bpm.  Accels present.  Decels absent. moderate in degree variability.   Tocometer: contractions q 1-4 minutes, irregular Cervix: 6/100/-1/SROM Extremities: Nontender, no edema.   Pitocin: 9 mIU  Labs:  No new labs   Assessment:  1: SIUP at 169w0d 2. Mild pre-eclampsia  Plan:  1. Continue Pitocin for augmentation per protocol.  2. Continue to monitor BPs. Will initiate pre-eclampsia protocol if severe range BPs or symptoms occur.    Hildred LaserAnika Rahaf Carbonell, MD 07/05/2016 5:24 PM

## 2016-07-06 LAB — CBC
HEMATOCRIT: 30.5 % — AB (ref 35.0–47.0)
Hemoglobin: 10.1 g/dL — ABNORMAL LOW (ref 12.0–16.0)
MCH: 27.6 pg (ref 26.0–34.0)
MCHC: 33.2 g/dL (ref 32.0–36.0)
MCV: 83.2 fL (ref 80.0–100.0)
PLATELETS: 311 10*3/uL (ref 150–440)
RBC: 3.67 MIL/uL — ABNORMAL LOW (ref 3.80–5.20)
RDW: 14.6 % — AB (ref 11.5–14.5)
WBC: 22.1 10*3/uL — ABNORMAL HIGH (ref 3.6–11.0)

## 2016-07-06 LAB — RPR: RPR Ser Ql: NONREACTIVE

## 2016-07-06 MED ORDER — METHYLERGONOVINE MALEATE 0.2 MG/ML IJ SOLN: 0.2000 mg | INTRAMUSCULAR | Status: DC | PRN

## 2016-07-06 MED ORDER — DOCUSATE SODIUM 100 MG PO CAPS
100.0000 mg | ORAL_CAPSULE | Freq: Two times a day (BID) | ORAL | Status: DC
  Administered 2016-07-06 – 2016-07-07 (×3): 100 mg via ORAL
  Filled 2016-07-06 (×3): qty 1

## 2016-07-06 MED ORDER — COCONUT OIL OIL
1.0000 "application " | TOPICAL_OIL | Status: DC | PRN
  Administered 2016-07-06: 1 via TOPICAL
  Filled 2016-07-06: qty 120

## 2016-07-06 MED ORDER — VARICELLA VIRUS VACCINE LIVE 1350 PFU/0.5ML IJ SUSR
0.5000 mL | INTRAMUSCULAR | Status: AC | PRN
  Administered 2016-07-07: 0.5 mL via SUBCUTANEOUS
  Filled 2016-07-06: qty 0.5

## 2016-07-06 MED ORDER — DIBUCAINE 1 % RE OINT: 1.0000 "application " | TOPICAL_OINTMENT | RECTAL | Status: DC | PRN

## 2016-07-06 MED ORDER — BENZOCAINE-MENTHOL 20-0.5 % EX AERO
1.0000 "application " | INHALATION_SPRAY | CUTANEOUS | Status: DC | PRN
  Administered 2016-07-06 – 2016-07-07 (×2): 1 via TOPICAL
  Filled 2016-07-06 (×2): qty 56

## 2016-07-06 MED ORDER — PRENATAL MULTIVITAMIN CH
1.0000 | ORAL_TABLET | Freq: Every day | ORAL | Status: DC
  Administered 2016-07-06 – 2016-07-07 (×2): 1 via ORAL
  Filled 2016-07-06 (×2): qty 1

## 2016-07-06 MED ORDER — DIPHENHYDRAMINE HCL 25 MG PO CAPS: 25.0000 mg | ORAL_CAPSULE | Freq: Four times a day (QID) | ORAL | Status: DC | PRN

## 2016-07-06 MED ORDER — METHYLERGONOVINE MALEATE 0.2 MG PO TABS: 0.2000 mg | ORAL_TABLET | ORAL | Status: DC | PRN

## 2016-07-06 MED ORDER — ONDANSETRON HCL 4 MG/2ML IJ SOLN: 4.0000 mg | INTRAMUSCULAR | Status: DC | PRN

## 2016-07-06 MED ORDER — WITCH HAZEL-GLYCERIN EX PADS
1.0000 | MEDICATED_PAD | CUTANEOUS | Status: DC | PRN
Start: 2016-07-06 — End: 2016-07-07

## 2016-07-06 MED ORDER — ZOLPIDEM TARTRATE 5 MG PO TABS: 5.0000 mg | ORAL_TABLET | Freq: Every evening | ORAL | Status: DC | PRN

## 2016-07-06 MED ORDER — SIMETHICONE 80 MG PO CHEW: 80.0000 mg | CHEWABLE_TABLET | ORAL | Status: DC | PRN

## 2016-07-06 MED ORDER — ACETAMINOPHEN 325 MG PO TABS: 650.0000 mg | ORAL_TABLET | ORAL | Status: DC | PRN

## 2016-07-06 MED ORDER — ONDANSETRON HCL 4 MG PO TABS: 4.0000 mg | ORAL_TABLET | ORAL | Status: DC | PRN

## 2016-07-06 NOTE — Progress Notes (Signed)
Post Partum Day # 1, s/p SVD  Subjective: no complaints, up ad lib, voiding and tolerating PO. Pain well controlled. Breastfeeding is going well.   Objective: Temp:  [97.9 F (36.6 C)-99.2 F (37.3 C)] 97.9 F (36.6 C) (03/29 0700) Pulse Rate:  [62-286] 71 (03/29 0700) Resp:  [18-22] 20 (03/29 0700) BP: (101-156)/(63-97) 115/73 (03/29 0700) SpO2:  [99 %] 99 % (03/29 0700)  Physical Exam:  General: alert and no distress  Lungs: clear to auscultation bilaterally Breasts: normal appearance, no masses or tenderness Heart: regular rate and rhythm, S1, S2 normal, no murmur, click, rub or gallop  Pelvis: Lochia: appropriate, Uterine Fundus: firm Extremities: DVT Evaluation: No evidence of DVT seen on physical exam. Negative Homan's sign. No cords or calf tenderness. No significant calf/ankle edema.   Recent Labs  07/05/16 0919 07/06/16 0605  HGB 12.0 10.1*  HCT 35.7 30.5*    Assessment/Plan: Breastfeeding and bottle feeding, Lactation consult,  Circumcision prior to discharge  Contraception now undecided.  Discussed contraception options, will give handout at discharge.  Plan for discharge tomorrow   LOS: 2 days   Hildred LaserAnika Rockell Faulks, MD [facility] 07/06/2016 12:13 PM

## 2016-07-06 NOTE — Lactation Note (Signed)
This note was copied from a baby's chart. Lactation Consultation Note  Patient Name: Jaime Mcintosh ZOXWR'UToday's Date: 07/06/2016 Reason for consult: Initial assessment   Maternal Data Does the patient have breastfeeding experience prior to this delivery?: No  Feeding Feeding Type: Breast Fed Nipple Type: Slow - flow Length of feed: 13 min  LATCH Score/Interventions Latch: Grasps breast easily, tongue down, lips flanged, rhythmical sucking.  Audible Swallowing: Spontaneous and intermittent  Type of Nipple: Everted at rest and after stimulation  Comfort (Breast/Nipple): Soft / non-tender     Hold (Positioning): Assistance needed to correctly position infant at breast and maintain latch.  LATCH Score: 9  Lactation Tools Discussed/Used WIC Program: No Pump Review: Setup, frequency, and cleaning   Consult Status Consult Status: Follow-up Follow-up type: In-patient    Trudee GripCarolyn P Gurtaj Ruz 07/06/2016, 11:44 AM

## 2016-07-06 NOTE — Lactation Note (Signed)
This note was copied from a baby's chart. Lactation Consultation Note  Patient Name: Jaime Garnette Czechlison Nottingham UJWJX'BToday's Date: 07/06/2016 Reason for consult: Follow-up assessment   Maternal Data Formula Feeding for Exclusion: No Has patient been taught Hand Expression?: Yes Does the patient have breastfeeding experience prior to this delivery?: No  Feeding Feeding Type: Breast Fed Length of feed: 5 min  LATCH Score/Interventions Latch: Repeated attempts needed to sustain latch, nipple held in mouth throughout feeding, stimulation needed to elicit sucking reflex.  Audible Swallowing: A few with stimulation Intervention(s): Hand expression;Skin to skin  Type of Nipple: Everted at rest and after stimulation  Comfort (Breast/Nipple): Soft / non-tender     Hold (Positioning): Assistance needed to correctly position infant at breast and maintain latch. Intervention(s): Support Pillows;Breastfeeding basics reviewed;Position options  LATCH Score: 7  Lactation Tools Discussed/Used     Consult Status Follow-up type: In-patient    Jaime Mcintosh 07/06/2016, 4:47 PM

## 2016-07-06 NOTE — Anesthesia Postprocedure Evaluation (Signed)
Anesthesia Post Note  Patient: Jaime Mcintosh  Procedure(s) Performed: * No procedures listed *  Patient location during evaluation: Mother Baby Anesthesia Type: Epidural Level of consciousness: awake, awake and alert and oriented Pain management: pain level controlled Respiratory status: spontaneous breathing, nonlabored ventilation and respiratory function stable Cardiovascular status: stable Postop Assessment: no headache and no backache Anesthetic complications: no     Last Vitals:  Vitals:   07/06/16 0328 07/06/16 0700  BP: 120/77 115/73  Pulse: 74 71  Resp: 18 20  Temp: 36.7 C 36.6 C    Last Pain:  Vitals:   07/06/16 0700  TempSrc: Oral  PainSc:                  Lyn RecordsNoles,  Levi Klaiber R

## 2016-07-07 LAB — BPAM RBC
BLOOD PRODUCT EXPIRATION DATE: 201805032359
BLOOD PRODUCT EXPIRATION DATE: 201805032359
UNIT TYPE AND RH: 9500
UNIT TYPE AND RH: 9500

## 2016-07-07 LAB — TYPE AND SCREEN
ABO/RH(D): O NEG
Antibody Screen: POSITIVE
UNIT DIVISION: 0
UNIT DIVISION: 0

## 2016-07-07 MED ORDER — IBUPROFEN 800 MG PO TABS
800.0000 mg | ORAL_TABLET | Freq: Three times a day (TID) | ORAL | 1 refills | Status: DC | PRN
Start: 2016-07-07 — End: 2016-08-16

## 2016-07-07 MED ORDER — DOCUSATE SODIUM 100 MG PO CAPS: 100.0000 mg | ORAL_CAPSULE | Freq: Two times a day (BID) | ORAL | 2 refills | Status: DC | PRN

## 2016-07-07 NOTE — Discharge Instructions (Signed)
Please call your doctor or return to the ER if you experience any chest pains, shortness of breath, dizziness, visual changes, fever greater than 101, any heavy bleeding (saturating more than 1 pad per hour), large clots, or foul smelling discharge, any worsening abdominal pain and cramping that is not controlled by pain medication, or any signs of postpartum depression. No tampons, enemas, douches, or sexual intercourse for 6 weeks. Also avoid tub baths, hot tubs, or swimming for 6 weeks.     General Postpartum Discharge Instructions  Do not drink alcohol or take tranquilizers.  Do not take medicine that has not been prescribed by your doctor.  Take showers instead of baths until your doctor gives you permission to take baths.  No sexual intercourse or placement of anything in the vagina for 6 weeks or as instructed by your doctor. Only take prescription or over-the-counter medicines  for pain, discomfort, or fever as directed by your doctor. Take medicines (antibiotics) that kill germs if they are prescribed for you.   Call the office or go to the Emergency Room if:  You feel sick to your stomach (nauseous).  You start to throw up (vomit).  You have trouble eating or drinking.  You have an oral temperature above 101.  You have constipation that is not helped by adjusting diet or increasing fluid intake. Pain medicines are a common cause of constipation.  You have foul smelling vaginal discharge or odor.  You have bleeding requiring changing more than 1 pad per hour. You have any other concerns.  SEEK IMMEDIATE MEDICAL CARE IF:  You have persistent dizziness.  You have difficulty breathing or shortness of breath.  You have an oral temperature above 102.5, not controlled by medicine.

## 2016-07-07 NOTE — Progress Notes (Signed)
Discharge order received from doctor. Varicella vaccine given prior to discharge. Reviewed discharge instructions and prescriptions with patient and answered all questions. Follow up appointment instructions given. Patient verbalized understanding. ID bands checked. Patient discharged home with infant via wheelchair by nursing/auxillary.    Janiece Scovill Garner, RN  

## 2016-07-07 NOTE — Discharge Summary (Signed)
Obstetric Discharge Summary Reason for Admission: induction of labor, mild pre-eclampsia Prenatal Procedures: ultrasound Intrapartum Procedures: spontaneous vaginal delivery Postpartum Procedures: Rho(D) Ig and Varicella Ig Complications-Operative and Postpartum: vaginal laceration  (1st degree, left)  Hemoglobin  Date Value Ref Range Status  07/06/2016 10.1 (L) 12.0 - 16.0 g/dL Final   HCT  Date Value Ref Range Status  07/06/2016 30.5 (L) 35.0 - 47.0 % Final   Hematocrit  Date Value Ref Range Status  04/19/2016 33.7 (L) 34.0 - 46.6 % Final    Physical Exam:  Blood pressure 120/75, pulse 73, temperature 97.9 F (36.6 C), temperature source Oral, resp. rate 17, height  (1.626 m), weight 219 lb (99.3 kg), last menstrual period 10/06/2015, SpO2 100 %, unknown if currently breastfeeding.  General: alert and no distress Lochia: appropriate Uterine Fundus: firm Incision: none DVT Evaluation: No evidence of DVT seen on physical exam. Negative Homan's sign. No cords or calf tenderness. No significant calf/ankle edema.  Discharge Diagnoses: Term Pregnancy-delivered and Preelampsia (mild)  Discharge Information: Date: 07/07/2016 Activity: pelvic rest Diet: routine Medications: PNV, Ibuprofen and Colace Condition: stable Instructions: refer to practice specific booklet Discharge to: home Follow-up Information    Hildred Laser, MD Follow up in 6 week(s).   Specialties:  Obstetrics and Gynecology, Radiology Why:  Postpartum visit Contact information: [address] [place] [address] [phone]           Newborn Data: Live born female  Birth Weight: 9 lb 6.6 oz (4270 g) APGAR: 2, 7  Home with mother.  Hildred Laser 07/07/2016, 9:50 AM

## 2016-07-07 NOTE — Lactation Note (Signed)
This note was copied from a baby's chart. Lactation Consultation Note  Patient Name: Jaime Mcintosh ZOXWR'U Date: 07/07/2016 Reason for consult: Follow-up assessment   Maternal Data Formula Feeding for Exclusion: No Has patient been taught Hand Expression?: Yes Does the patient have breastfeeding experience prior to this delivery?: No  Feeding Feeding Type: Breast Fed Length of feed: 30 min  LATCH Score/Interventions     I did not observe feeding, mom states that baby is latching and nursing well, cluster fed during night, she hears swallows, sister in law is an LC  At [facility] peds.  She is going to be discharged today                 Lactation Tools Discussed/Used WIC Program: No   Consult Status Consult Status: Complete    Dyann Kief 07/07/2016, 12:35 PM

## 2016-07-12 ENCOUNTER — Encounter: Payer: Managed Care, Other (non HMO) | Admitting: Obstetrics and Gynecology

## 2016-08-16 ENCOUNTER — Encounter: Payer: Self-pay | Admitting: Obstetrics and Gynecology

## 2016-08-16 ENCOUNTER — Ambulatory Visit (INDEPENDENT_AMBULATORY_CARE_PROVIDER_SITE_OTHER): Payer: 59 | Admitting: Obstetrics and Gynecology

## 2016-08-16 ENCOUNTER — Encounter: Payer: 59 | Admitting: Obstetrics and Gynecology

## 2016-08-16 DIAGNOSIS — Z3009 Encounter for other general counseling and advice on contraception: Secondary | ICD-10-CM

## 2016-08-16 DIAGNOSIS — Z8759 Personal history of other complications of pregnancy, childbirth and the puerperium: Secondary | ICD-10-CM

## 2016-08-16 NOTE — Progress Notes (Signed)
   OBSTETRICS POSTPARTUM CLINIC PROGRESS NOTE  Subjective:     Arthor Captainlison K Whisnant is a 26 y.o. 151P1001 female who presents for a postpartum visit. She is 6 weeks postpartum following a spontaneous vaginal delivery. I have fully reviewed the prenatal and intrapartum course. The pregnancy was complicated by mild pre-eclampsia at term. The delivery was at 39 gestational weeks.  Anesthesia: epidural. Postpartum course has been well. Baby's course has been well. Baby is feeding by breast. Bleeding: patient has not resumed menses, with No LMP recorded. Patient is not currently having periods (Reason: Lactating).. Bowel function is normal. Bladder function is normal. Patient is not sexually active. Contraception method desired is unsure. Postpartum depression screening: negative (PHQ-9 score is 1).  The following portions of the patient's history were reviewed and updated as appropriate: allergies, current medications, past family history, past medical history, past social history, past surgical history and problem list.  Review of Systems Pertinent items noted in HPI and remainder of comprehensive ROS otherwise negative.   Objective:    BP 105/66 (BP Location: Left Arm, Patient Position: Sitting, Cuff Size: Normal)   Pulse 74   Ht 5\' 4"  (1.626 m)   Wt 183 lb 14.4 oz (83.4 kg)   Breastfeeding? Yes   BMI 31.57 kg/m   General:  alert and no distress   Breasts:  inspection negative, no nipple discharge or bleeding, no masses or nodularity palpable  Lungs: clear to auscultation bilaterally  Heart:  regular rate and rhythm, S1, S2 normal, no murmur, click, rub or gallop  Abdomen: soft, non-tender; bowel sounds normal; no masses,  no organomegaly.     Vulva:  normal  Vagina: normal vagina, no discharge, exudate, lesion, or erythema  Cervix:  no cervical motion tenderness and no lesions  Corpus: normal size, contour, position, consistency, mobility, non-tender  Adnexa:  normal adnexa and no mass,  fullness, tenderness  Rectal Exam: Not performed.         Labs:  Lab Results  Component Value Date   HGB 10.1 (L) 07/06/2016    Assessment:    Routine postpartum exam.    Mild-pre-eclampsia in pregnancy Contraception counseling   Plan:    1. Contraception: unsure method.  Encouraged patient to use condoms until method decided upon.    Reviewed all forms of birth control options available including abstinence; over the counter/barrier methods; hormonal contraceptive medication including pill, patch, ring, injection,contraceptive implant; hormonal and nonhormonal IUDs; permanent sterilization options including vasectomy and the various tubal sterilization modalities. Risks and benefits reviewed.  Questions were answered.  Information was given to patient to review.  2. Mild pre-eclampsia in pregnancy, with normal BPs today.   3. Follow up in: 6 months for annual exam, or sooner as needed.    Hildred Laserherry, Senaida Chilcote, MD [facility]

## 2016-08-30 ENCOUNTER — Encounter: Payer: Self-pay | Admitting: Obstetrics and Gynecology

## 2017-02-09 ENCOUNTER — Encounter: Payer: Self-pay | Admitting: Obstetrics and Gynecology

## 2017-02-22 ENCOUNTER — Encounter: Payer: 59 | Admitting: Obstetrics and Gynecology

## 2018-03-04 ENCOUNTER — Other Ambulatory Visit (HOSPITAL_COMMUNITY)
Admission: RE | Admit: 2018-03-04 | Discharge: 2018-03-04 | Disposition: A | Discharge status: Home / Self Care | Payer: 59 | Source: Ambulatory Visit | Attending: Obstetrics and Gynecology | Admitting: Obstetrics and Gynecology

## 2018-03-04 ENCOUNTER — Ambulatory Visit (INDEPENDENT_AMBULATORY_CARE_PROVIDER_SITE_OTHER): Payer: 59 | Admitting: Obstetrics and Gynecology

## 2018-03-04 ENCOUNTER — Encounter: Payer: Self-pay | Admitting: Obstetrics and Gynecology

## 2018-03-04 VITALS — BP 112/78 | HR 69 | Ht 64.0 in | Wt 192.6 lb

## 2018-03-04 DIAGNOSIS — Z113 Encounter for screening for infections with a predominantly sexual mode of transmission: Secondary | ICD-10-CM | POA: Insufficient documentation

## 2018-03-04 DIAGNOSIS — Z30013 Encounter for initial prescription of injectable contraceptive: Secondary | ICD-10-CM

## 2018-03-04 DIAGNOSIS — Z23 Encounter for immunization: Secondary | ICD-10-CM | POA: Diagnosis not present

## 2018-03-04 DIAGNOSIS — Z01419 Encounter for gynecological examination (general) (routine) without abnormal findings: Secondary | ICD-10-CM | POA: Diagnosis not present

## 2018-03-04 DIAGNOSIS — E669 Obesity, unspecified: Secondary | ICD-10-CM

## 2018-03-04 MED ORDER — MEDROXYPROGESTERONE ACETATE 150 MG/ML IM SUSP: 150.0000 mg | Freq: Once | INTRAMUSCULAR | 3 refills | Status: DC

## 2018-03-04 NOTE — Patient Instructions (Signed)
Health Maintenance, Female Adopting a healthy lifestyle and getting preventive care can go a long way to promote health and wellness. Talk with your health care provider about what schedule of regular examinations is right for you. This is a good chance for you to check in with your provider about disease prevention and staying healthy. In between checkups, there are plenty of things you can do on your own. Experts have done a lot of research about which lifestyle changes and preventive measures are most likely to keep you healthy. Ask your health care provider for more information. Weight and diet Eat a healthy diet  Be sure to include plenty of vegetables, fruits, low-fat dairy products, and lean protein.  Do not eat a lot of foods high in solid fats, added sugars, or salt.  Get regular exercise. This is one of the most important things you can do for your health. ? Most adults should exercise for at least 150 minutes each week. The exercise should increase your heart rate and make you sweat (moderate-intensity exercise). ? Most adults should also do strengthening exercises at least twice a week. This is in addition to the moderate-intensity exercise.  Maintain a healthy weight  Body mass index (BMI) is a measurement that can be used to identify possible weight problems. It estimates body fat based on height and weight. Your health care provider can help determine your BMI and help you achieve or maintain a healthy weight.  For females 20 years of age and older: ? A BMI below 18.5 is considered underweight. ? A BMI of 18.5 to 24.9 is normal. ? A BMI of 25 to 29.9 is considered overweight. ? A BMI of 30 and above is considered obese.  Watch levels of cholesterol and blood lipids  You should start having your blood tested for lipids and cholesterol at 25 years of age, then have this test every 5 years.  You may need to have your cholesterol levels checked more often if: ? Your lipid or  cholesterol levels are high. ? You are older than 25 years of age. ? You are at high risk for heart disease.  Cancer screening Lung Cancer  Lung cancer screening is recommended for adults 55-80 years old who are at high risk for lung cancer because of a history of smoking.  A yearly low-dose CT scan of the lungs is recommended for people who: ? Currently smoke. ? Have quit within the past 15 years. ? Have at least a 30-pack-year history of smoking. A pack year is smoking an average of one pack of cigarettes a day for 1 year.  Yearly screening should continue until it has been 15 years since you quit.  Yearly screening should stop if you develop a health problem that would prevent you from having lung cancer treatment.  Breast Cancer  Practice breast self-awareness. This means understanding how your breasts normally appear and feel.  It also means doing regular breast self-exams. Let your health care provider know about any changes, no matter how small.  If you are in your 20s or 30s, you should have a clinical breast exam (CBE) by a health care provider every 1-3 years as part of a regular health exam.  If you are 40 or older, have a CBE every year. Also consider having a breast X-ray (mammogram) every year.  If you have a family history of breast cancer, talk to your health care provider about genetic screening.  If you are at high risk   for breast cancer, talk to your health care provider about having an MRI and a mammogram every year.  Breast cancer gene (BRCA) assessment is recommended for women who have family members with BRCA-related cancers. BRCA-related cancers include: ? Breast. ? Ovarian. ? Tubal. ? Peritoneal cancers.  Results of the assessment will determine the need for genetic counseling and BRCA1 and BRCA2 testing.  Cervical Cancer Your health care provider may recommend that you be screened regularly for cancer of the pelvic organs (ovaries, uterus, and  vagina). This screening involves a pelvic examination, including checking for microscopic changes to the surface of your cervix (Pap test). You may be encouraged to have this screening done every 3 years, beginning at age 22.  For women ages 56-65, health care providers may recommend pelvic exams and Pap testing every 3 years, or they may recommend the Pap and pelvic exam, combined with testing for human papilloma virus (HPV), every 5 years. Some types of HPV increase your risk of cervical cancer. Testing for HPV may also be done on women of any age with unclear Pap test results.  Other health care providers may not recommend any screening for nonpregnant women who are considered low risk for pelvic cancer and who do not have symptoms. Ask your health care provider if a screening pelvic exam is right for you.  If you have had past treatment for cervical cancer or a condition that could lead to cancer, you need Pap tests and screening for cancer for at least 20 years after your treatment. If Pap tests have been discontinued, your risk factors (such as having a new sexual partner) need to be reassessed to determine if screening should resume. Some women have medical problems that increase the chance of getting cervical cancer. In these cases, your health care provider may recommend more frequent screening and Pap tests.  Colorectal Cancer  This type of cancer can be detected and often prevented.  Routine colorectal cancer screening usually begins at 25 years of age and continues through 25 years of age.  Your health care provider may recommend screening at an earlier age if you have risk factors for colon cancer.  Your health care provider may also recommend using home test kits to check for hidden blood in the stool.  A small camera at the end of a tube can be used to examine your colon directly (sigmoidoscopy or colonoscopy). This is done to check for the earliest forms of colorectal  cancer.  Routine screening usually begins at age 33.  Direct examination of the colon should be repeated every 5-10 years through 25 years of age. However, you may need to be screened more often if early forms of precancerous polyps or small growths are found.  Skin Cancer  Check your skin from head to toe regularly.  Tell your health care provider about any new moles or changes in moles, especially if there is a change in a mole's shape or color.  Also tell your health care provider if you have a mole that is larger than the size of a pencil eraser.  Always use sunscreen. Apply sunscreen liberally and repeatedly throughout the day.  Protect yourself by wearing long sleeves, pants, a wide-brimmed hat, and sunglasses whenever you are outside.  Heart disease, diabetes, and high blood pressure  High blood pressure causes heart disease and increases the risk of stroke. High blood pressure is more likely to develop in: ? People who have blood pressure in the high end of  the normal range (130-139/85-89 mm Hg). ? People who are overweight or obese. ? People who are African American.  If you are 21-29 years of age, have your blood pressure checked every 3-5 years. If you are 3 years of age or older, have your blood pressure checked every year. You should have your blood pressure measured twice-once when you are at a hospital or clinic, and once when you are not at a hospital or clinic. Record the average of the two measurements. To check your blood pressure when you are not at a hospital or clinic, you can use: ? An automated blood pressure machine at a pharmacy. ? A home blood pressure monitor.  If you are between 17 years and 37 years old, ask your health care provider if you should take aspirin to prevent strokes.  Have regular diabetes screenings. This involves taking a blood sample to check your fasting blood sugar level. ? If you are at a normal weight and have a low risk for diabetes,  have this test once every three years after 25 years of age. ? If you are overweight and have a high risk for diabetes, consider being tested at a younger age or more often. Preventing infection Hepatitis B  If you have a higher risk for hepatitis B, you should be screened for this virus. You are considered at high risk for hepatitis B if: ? You were born in a country where hepatitis B is common. Ask your health care provider which countries are considered high risk. ? Your parents were born in a high-risk country, and you have not been immunized against hepatitis B (hepatitis B vaccine). ? You have HIV or AIDS. ? You use needles to inject street drugs. ? You live with someone who has hepatitis B. ? You have had sex with someone who has hepatitis B. ? You get hemodialysis treatment. ? You take certain medicines for conditions, including cancer, organ transplantation, and autoimmune conditions.  Hepatitis C  Blood testing is recommended for: ? Everyone born from [birth year] through 1963. ? Anyone with known risk factors for hepatitis C.  Sexually transmitted infections (STIs)  You should be screened for sexually transmitted infections (STIs) including gonorrhea and chlamydia if: ? You are sexually active and are younger than 25 years of age. ? You are older than 25 years of age and your health care provider tells you that you are at risk for this type of infection. ? Your sexual activity has changed since you were last screened and you are at an increased risk for chlamydia or gonorrhea. Ask your health care provider if you are at risk.  If you do not have HIV, but are at risk, it may be recommended that you take a prescription medicine daily to prevent HIV infection. This is called pre-exposure prophylaxis (PrEP). You are considered at risk if: ? You are sexually active and do not regularly use condoms or know the HIV status of your partner(s). ? You take drugs by injection. ? You are  sexually active with a partner who has HIV.  Talk with your health care provider about whether you are at high risk of being infected with HIV. If you choose to begin PrEP, you should first be tested for HIV. You should then be tested every 3 months for as long as you are taking PrEP. Pregnancy  If you are premenopausal and you may become pregnant, ask your health care provider about preconception counseling.  If you may become  pregnant, take 400 to 800 micrograms (mcg) of folic acid every day.  If you want to prevent pregnancy, talk to your health care provider about birth control (contraception). Osteoporosis and menopause  Osteoporosis is a disease in which the bones lose minerals and strength with aging. This can result in serious bone fractures. Your risk for osteoporosis can be identified using a bone density scan.  If you are 67 years of age or older, or if you are at risk for osteoporosis and fractures, ask your health care provider if you should be screened.  Ask your health care provider whether you should take a calcium or vitamin D supplement to lower your risk for osteoporosis.  Menopause may have certain physical symptoms and risks.  Hormone replacement therapy may reduce some of these symptoms and risks. Talk to your health care provider about whether hormone replacement therapy is right for you. Follow these instructions at home:  Schedule regular health, dental, and eye exams.  Stay current with your immunizations.  Do not use any tobacco products including cigarettes, chewing tobacco, or electronic cigarettes.  If you are pregnant, do not drink alcohol.  If you are breastfeeding, limit how much and how often you drink alcohol.  Limit alcohol intake to no more than 1 drink per day for nonpregnant women. One drink equals 12 ounces of beer, 5 ounces of wine, or 1 ounces of hard liquor.  Do not use street drugs.  Do not share needles.  Ask your health care  provider for help if you need support or information about quitting drugs.  Tell your health care provider if you often feel depressed.  Tell your health care provider if you have ever been abused or do not feel safe at home. This information is not intended to replace advice given to you by your health care provider. Make sure you discuss any questions you have with your health care provider. Document Released: 10/10/2010 Document Revised: 09/02/2015 Document Reviewed: 12/29/2014 Elsevier Interactive Patient Education  2018 [employer].     Medroxyprogesterone injection [Contraceptive] What is this medicine? MEDROXYPROGESTERONE (me DROX ee proe JES te rone) contraceptive injections prevent pregnancy. They provide effective birth control for 3 months. Depo-subQ Provera 104 is also used for treating pain related to endometriosis. This medicine may be used for other purposes; ask your health care provider or pharmacist if you have questions. COMMON BRAND NAME(S): Depo-Provera, Depo-subQ Provera 104 What should I tell my health care provider before I take this medicine? They need to know if you have any of these conditions: -frequently drink alcohol -asthma -blood vessel disease or a history of a blood clot in the lungs or legs -bone disease such as osteoporosis -breast cancer -diabetes -eating disorder (anorexia nervosa or bulimia) -high blood pressure -HIV infection or AIDS -kidney disease -liver disease -mental depression -migraine -seizures (convulsions) -stroke -tobacco smoker -vaginal bleeding -an unusual or allergic reaction to medroxyprogesterone, other hormones, medicines, foods, dyes, or preservatives -pregnant or trying to get pregnant -breast-feeding How should I use this medicine? Depo-Provera Contraceptive injection is given into a muscle. Depo-subQ Provera 104 injection is given under the skin. These injections are given by a health care professional. You must  not be pregnant before getting an injection. The injection is usually given during the first 5 days after the start of a menstrual period or 6 weeks after delivery of a baby. Talk to your pediatrician regarding the use of this medicine in children. Special care may be needed. These  injections have been used in female children who have started having menstrual periods. Overdosage: If you think you have taken too much of this medicine contact a poison control center or emergency room at once. NOTE: This medicine is only for you. Do not share this medicine with others. What if I miss a dose? Try not to miss a dose. You must get an injection once every 3 months to maintain birth control. If you cannot keep an appointment, call and reschedule it. If you wait longer than 13 weeks between Depo-Provera contraceptive injections or longer than 14 weeks between Depo-subQ Provera 104 injections, you could get pregnant. Use another method for birth control if you miss your appointment. You may also need a pregnancy test before receiving another injection. What may interact with this medicine? Do not take this medicine with any of the following medications: -bosentan This medicine may also interact with the following medications: -aminoglutethimide -antibiotics or medicines for infections, especially rifampin, rifabutin, rifapentine, and griseofulvin -aprepitant -barbiturate medicines such as phenobarbital or primidone -bexarotene -carbamazepine -medicines for seizures like ethotoin, felbamate, oxcarbazepine, phenytoin, topiramate -modafinil -St. John's wort This list may not describe all possible interactions. Give your health care provider a list of all the medicines, herbs, non-prescription drugs, or dietary supplements you use. Also tell them if you smoke, drink alcohol, or use illegal drugs. Some items may interact with your medicine. What should I watch for while using this medicine? This drug does not  protect you against HIV infection (AIDS) or other sexually transmitted diseases. Use of this product may cause you to lose calcium from your bones. Loss of calcium may cause weak bones (osteoporosis). Only use this product for more than 2 years if other forms of birth control are not right for you. The longer you use this product for birth control the more likely you will be at risk for weak bones. Ask your health care professional how you can keep strong bones. You may have a change in bleeding pattern or irregular periods. Many females stop having periods while taking this drug. If you have received your injections on time, your chance of being pregnant is very low. If you think you may be pregnant, see your health care professional as soon as possible. Tell your health care professional if you want to get pregnant within the next year. The effect of this medicine may last a long time after you get your last injection. What side effects may I notice from receiving this medicine? Side effects that you should report to your doctor or health care professional as soon as possible: -allergic reactions like skin rash, itching or hives, swelling of the face, lips, or tongue -breast tenderness or discharge -breathing problems -changes in vision -depression -feeling faint or lightheaded, falls -fever -pain in the abdomen, chest, groin, or leg -problems with balance, talking, walking -unusually weak or tired -yellowing of the eyes or skin Side effects that usually do not require medical attention (report to your doctor or health care professional if they continue or are bothersome): -acne -fluid retention and swelling -headache -irregular periods, spotting, or absent periods -temporary pain, itching, or skin reaction at site where injected -weight gain This list may not describe all possible side effects. Call your doctor for medical advice about side effects. You may report side effects to FDA at  1-800-FDA-1088. Where should I keep my medicine? This does not apply. The injection will be given to you by a health care professional. NOTE: This sheet  is a summary. It may not cover all possible information. If you have questions about this medicine, talk to your doctor, pharmacist, or health care provider.  2018 Elsevier/Gold Standard (2008-04-17 18:37:56)    HPV (Human Papillomavirus) Vaccine: What You Need to Know 1. Why get vaccinated? HPV vaccine prevents infection with human papillomavirus (HPV) types that are associated with many cancers, including:  cervical cancer in females,  vaginal and vulvar cancers in females,  anal cancer in females and males,  throat cancer in females and males, and  penile cancer in males.  In addition, HPV vaccine prevents infection with HPV types that cause genital warts in both females and males. In the [place] about 12,000 women get cervical cancer every year, and about 4,000 women die from it. HPV vaccine can prevent most of these cases of cervical cancer. Vaccination is not a substitute for cervical cancer screening. This vaccine does not protect against all HPV types that can cause cervical cancer. Women should still get regular Pap tests. HPV infection usually comes from sexual contact, and most people will become infected at some point in their life. About 14 million Americans, including teens, get infected every year. Most infections will go away on their own and not cause serious problems. But thousands of women and men get cancer and other diseases from HPV. 2. HPV vaccine HPV vaccine is approved by FDA and is recommended by CDC for both males and females. It is routinely given at 14 or 25 years of age, but it may be given beginning at age 53 years through age 42 years. Most adolescents 9 through 25 years of age should get HPV vaccine as a two-dose series with the doses separated by 6-12 months. People who start HPV vaccination at 3 years of  age and older should get the vaccine as a three-dose series with the second dose given 1-2 months after the first dose and the third dose given 6 months after the first dose. There are several exceptions to these age recommendations. Your health care provider can give you more information. 3. Some people should not get this vaccine  Anyone who has had a severe (life-threatening) allergic reaction to a dose of HPV vaccine should not get another dose.  Anyone who has a severe (life threatening) allergy to any component of HPV vaccine should not get the vaccine.  Tell your doctor if you have any severe allergies that you know of, including a severe allergy to yeast.  HPV vaccine is not recommended for pregnant women. If you learn that you were pregnant when you were vaccinated, there is no reason to expect any problems for you or your baby. Any woman who learns she was pregnant when she got HPV vaccine is encouraged to contact the manufacturer's registry for HPV vaccination during pregnancy at [phone]. Women who are breastfeeding may be vaccinated.  If you have a mild illness, such as a cold, you can probably get the vaccine today. If you are moderately or severely ill, you should probably wait until you recover. Your doctor can advise you. 4. Risks of a vaccine reaction With any medicine, including vaccines, there is a chance of side effects. These are usually mild and go away on their own, but serious reactions are also possible. Most people who get HPV vaccine do not have any serious problems with it. Mild or moderate problems following HPV vaccine:  Reactions in the arm where the shot was given: ? Soreness (about 9 people in 10) ?  Redness or swelling (about 1 person in 3)  Fever: ? Mild (100F) (about 1 person in 10) ? Moderate (102F) (about 1 person in 17)  Other problems: ? Headache (about 1 person in 3) Problems that could happen after any injected vaccine:  People sometimes  faint after a medical procedure, including vaccination. Sitting or lying down for about 15 minutes can help prevent fainting, and injuries caused by a fall. Tell your doctor if you feel dizzy, or have vision changes or ringing in the ears.  Some people get severe pain in the shoulder and have difficulty moving the arm where a shot was given. This happens very rarely.  Any medication can cause a severe allergic reaction. Such reactions from a vaccine are very rare, estimated at about 1 in a million doses, and would happen within a few minutes to a few hours after the vaccination. As with any medicine, there is a very remote chance of a vaccine causing a serious injury or death. The safety of vaccines is always being monitored. For more information, visit: http://www.aguilar.org/. 5. What if there is a serious reaction? What should I look for? Look for anything that concerns you, such as signs of a severe allergic reaction, very high fever, or unusual behavior. Signs of a severe allergic reaction can include hives, swelling of the face and throat, difficulty breathing, a fast heartbeat, dizziness, and weakness. These would usually start a few minutes to a few hours after the vaccination. What should I do? If you think it is a severe allergic reaction or other emergency that can't wait, call 9-1-1 or get to the nearest hospital. Otherwise, call your doctor. Afterward, the reaction should be reported to the Vaccine Adverse Event Reporting System (VAERS). Your doctor should file this report, or you can do it yourself through the VAERS web site at www.vaers.SamedayNews.es, or by calling 4181304011. VAERS does not give medical advice. 6. The National Vaccine Injury Compensation Program The [employer] Vaccine Injury Compensation Program (VICP) is a federal program that was created to compensate people who may have been injured by certain vaccines. Persons who believe they may have been injured by a vaccine  can learn about the program and about filing a claim by calling 949-173-9080 or visiting the [place] website at GoldCloset.com.ee. There is a time limit to file a claim for compensation. 7. How can I learn more?  Ask your health care provider. He or she can give you the vaccine package insert or suggest other sources of information.  Call your local or state health department.  Contact the Centers for Disease Control and Prevention (CDC): ? Call (706)881-0784 (1-800-CDC-INFO) or ? Visit CDC's website at http://sweeney-todd.com/ Vaccine Information Statement, HPV Vaccine (03/12/2015) This information is not intended to replace advice given to you by your health care provider. Make sure you discuss any questions you have with your health care provider. Document Released: 10/22/2013 Document Revised: 12/16/2015 Document Reviewed: 12/16/2015 Elsevier Interactive Patient Education  2017 [employer].

## 2018-03-04 NOTE — Progress Notes (Signed)
GYNECOLOGY ANNUAL PHYSICAL EXAM PROGRESS NOTE  Subjective:    Jaime Mcintosh is a 27 y.o. 221P1001 female who presents for an annual exam. She has no complaints today. She is sexually active (new partner since last visit in 2018).  The patient wears seatbelts: yes. The patient participates in regular exercise: not frequently. Has the patient ever been transfused or tattooed?: yes (professionally done). Jaime Mcintosh reports that there is not domestic violence in her life.   Jaime Mcintosh desires to discuss contraceptive options today.   Gynecologic History  Menarche age:  [age] Patient's last menstrual period was 02/16/2018. Contraception: none History of STI's: Denies Last Pap: 01/05/2016. Results were: normal.  Denies h/o abnormal pap smears.    OB History  Gravida Para Term Preterm AB Living  1 1 1  0 0 1  SAB TAB Ectopic Multiple Live Births  0 0 0 0 1    # Outcome Date GA Lbr Len/2nd Weight Sex Delivery Anes PTL Lv  1 Term 07/05/16 [id number]  9 lb 6.6 oz (4.27 kg) M Vag-Spont EPI  LIV     Name: Smigelski,BOY Caiya     Apgar1: 2  Apgar5: 7    Past Medical History:  Diagnosis Date  . Headache    hx migraines  . History of pre-eclampsia 06/2016   mild    Past Surgical History:  Procedure Laterality Date  . NO PAST SURGERIES      Family History  Problem Relation Age of Onset  . Heart disease Maternal Grandfather   . Diabetes Paternal Grandfather   . Arthritis Maternal Grandmother   . Hypertension Maternal Grandmother   . Varicose Veins Maternal Grandmother   . Vision loss Paternal Grandmother   . Stroke Neg Hx     Social History   Socioeconomic History  . Marital status: Married    Spouse name: Not on file  . Number of children: Not on file  . Years of education: Not on file  . Highest education level: Not on file  Occupational History  . Not on file  Social Needs  . Financial resource strain: Not on file  . Food insecurity:    Worry: Not on file    Inability: Not  on file  . Transportation needs:    Medical: Not on file    Non-medical: Not on file  Tobacco Use  . Smoking status: Never Smoker  . Smokeless tobacco: Never Used  Substance and Sexual Activity  . Alcohol use: Yes    Comment: occass  . Drug use: No  . Sexual activity: Yes    Partners: Female    Birth control/protection: None  Lifestyle  . Physical activity:    Days per week: Not on file    Minutes per session: Not on file  . Stress: Not on file  Relationships  . Social connections:    Talks on phone: Not on file    Gets together: Not on file    Attends religious service: Not on file    Active member of club or organization: Not on file    Attends meetings of clubs or organizations: Not on file    Relationship status: Not on file  . Intimate partner violence:    Fear of current or ex partner: Not on file    Emotionally abused: Not on file    Physically abused: Not on file    Forced sexual activity: Not on file  Other Topics Concern  . Not on file  Social History Narrative   Works nights    No current outpatient medications on file prior to visit.   No current facility-administered medications on file prior to visit.     No Known Allergies    Review of Systems Constitutional: negative for chills, fatigue, fevers and sweats Eyes: negative for irritation, redness and visual disturbance Ears, nose, mouth, throat, and face: negative for hearing loss, nasal congestion, snoring and tinnitus Respiratory: negative for asthma, cough, sputum Cardiovascular: negative for chest pain, dyspnea, exertional chest pressure/discomfort, irregular heart beat, palpitations and syncope Gastrointestinal: negative for abdominal pain, change in bowel habits, nausea and vomiting Genitourinary: negative for abnormal menstrual periods, genital lesions, sexual problems and vaginal discharge, dysuria and urinary incontinence Integument/breast: negative for breast lump, breast tenderness and  nipple discharge Hematologic/lymphatic: negative for bleeding and easy bruising Musculoskeletal:negative for back pain and muscle weakness Neurological: negative for dizziness, headaches, vertigo and weakness Endocrine: negative for diabetic symptoms including polydipsia, polyuria and skin dryness Allergic/Immunologic: negative for hay fever and urticaria        Objective:  Blood pressure 112/78, pulse 69, height 5\' 4"  (1.626 m), weight 192 lb 9.6 oz (87.4 kg), last menstrual period 02/16/2018, currently breastfeeding. Body mass index is 33.06 kg/m.  General Appearance:    Alert, cooperative, no distress, appears stated age  Head:    Normocephalic, without obvious abnormality, atraumatic  Eyes:    PERRL, conjunctiva/corneas clear, EOM's intact, both eyes  Ears:    Normal external ear canals, both ears  Nose:   Nares normal, septum midline, mucosa normal, no drainage or sinus tenderness  Throat:   Lips, mucosa, and tongue normal; teeth and gums normal  Neck:   Supple, symmetrical, trachea midline, no adenopathy; thyroid: no enlargement/tenderness/nodules; no carotid bruit or JVD  Back:     Symmetric, no curvature, ROM normal, no CVA tenderness  Lungs:     Clear to auscultation bilaterally, respirations unlabored  Chest Wall:    No tenderness or deformity   Heart:    Regular rate and rhythm, S1 and S2 normal, no murmur, rub or gallop  Breast Exam:    No tenderness, masses, or nipple abnormality  Abdomen:     Soft, non-tender, bowel sounds active all four quadrants, no masses, no organomegaly.    Genitalia:    Pelvic:external genitalia normal, vagina without lesions, discharge, or tenderness, rectovaginal septum  normal. Cervix normal in appearance, no cervical motion tenderness, no adnexal masses or tenderness.  Uterus normal size, shape, mobile, regular contours, nontender.  Rectal:    Normal external sphincter.  No hemorrhoids appreciated. Internal exam not done.   Extremities:    Extremities normal, atraumatic, no cyanosis or edema  Pulses:   2+ and symmetric all extremities  Skin:   Skin color, texture, turgor normal, no rashes or lesions  Lymph nodes:   Cervical, supraclavicular, and axillary nodes normal  Neurologic:   CNII-XII intact, normal strength, sensation and reflexes throughout    Labs:  Lab Results  Component Value Date   WBC 22.1 (H) 07/06/2016   HGB 10.1 (L) 07/06/2016   HCT 30.5 (L) 07/06/2016   MCV 83.2 07/06/2016   PLT 311 07/06/2016    Lab Results  Component Value Date   CREATININE 0.68 07/02/2016   BUN 12 07/02/2016   NA 134 (L) 07/02/2016   K 4.0 07/02/2016   CL 108 07/02/2016   CO2 18 (L) 07/02/2016    Lab Results  Component Value Date   ALT 7 (L)  07/02/2016   AST 22 07/02/2016   ALKPHOS 216 (H) 07/02/2016   BILITOT 1.0 07/02/2016    Lab Results  Component Value Date   TSH 0.58 04/28/2013     Assessment:   1. Encounter for well woman exam with routine gynecological exam  2. Encounter for initial prescription of injectable contraceptive  3. Flu vaccine need  4. Screen for STD (sexually transmitted disease)  5. Obesity, mild   Plan:    - Blood tests: CBC with diff and Comprehensive metabolic panel. - Breast self exam technique reviewed and patient encouraged to perform self-exam monthly. - Contraception: Reviewed all forms of birth control options available including abstinence; fertility period awareness methods; over the counter/barrier methods; hormonal contraceptive medication including pill, patch, ring, injection,contraceptive implant; hormonal and nonhormonal IUDs; permanent sterilization options including vasectomy and the various tubal sterilization modalities were not discussed. Risks and benefits reviewed.  Questions were answered.  Patient desires Dep Provera. Information was given to patient to review. Prescription sent to pharmacy. She will return in 1-2 days for injection.  - Discussed healthy  lifestyle modifications. - Pap smear up to date. Next due in 2020.  - Flu vaccine given today.  - STD screening performed today per recommendations.  - Return in 1 year for annual exam.     Hildred Laser, MD [facility]

## 2018-03-04 NOTE — Progress Notes (Signed)
   PT is present today for her annual exam. Pt stated that she has not been doing self-breast exams monthly. Pt stated that she is doing well and denies any issues. No problems or concerns.  Flu vaccine given.

## 2018-03-05 LAB — COMPREHENSIVE METABOLIC PANEL
ALK PHOS: 73 IU/L (ref 39–117)
ALT: 14 IU/L (ref 0–32)
AST: 15 IU/L (ref 0–40)
Albumin/Globulin Ratio: 1.7 (ref 1.2–2.2)
Albumin: 4.3 g/dL (ref 3.5–5.5)
BUN/Creatinine Ratio: 14 (ref 9–23)
BUN: 12 mg/dL (ref 6–20)
Bilirubin Total: 0.8 mg/dL (ref 0.0–1.2)
CO2: 23 mmol/L (ref 20–29)
CREATININE: 0.84 mg/dL (ref 0.57–1.00)
Calcium: 9.4 mg/dL (ref 8.7–10.2)
Chloride: 105 mmol/L (ref 96–106)
GFR calc Af Amer: 112 mL/min/{1.73_m2} (ref >59)
GFR calc non Af Amer: 97 mL/min/{1.73_m2} (ref >59)
GLUCOSE: 87 mg/dL (ref 65–99)
Globulin, Total: 2.5 g/dL (ref 1.5–4.5)
Potassium: 4.5 mmol/L (ref 3.5–5.2)
SODIUM: 141 mmol/L (ref 134–144)
Total Protein: 6.8 g/dL (ref 6.0–8.5)

## 2018-03-05 LAB — CBC
Hematocrit: 40.3 % (ref 34.0–46.6)
Hemoglobin: 13.4 g/dL (ref 11.1–15.9)
MCH: 28.6 pg (ref 26.6–33.0)
MCHC: 33.3 g/dL (ref 31.5–35.7)
MCV: 86 fL (ref 79–97)
PLATELETS: 478 10*3/uL — AB (ref 150–450)
RBC: 4.69 x10E6/uL (ref 3.77–5.28)
RDW: 12.6 % (ref 12.3–15.4)
WBC: 8.9 10*3/uL (ref 3.4–10.8)

## 2018-03-06 LAB — CERVICOVAGINAL ANCILLARY ONLY
CHLAMYDIA, DNA PROBE: NEGATIVE
NEISSERIA GONORRHEA: NEGATIVE
Trichomonas: NEGATIVE

## 2019-03-10 ENCOUNTER — Encounter: Payer: 59 | Admitting: Obstetrics and Gynecology

## 2019-03-10 NOTE — Progress Notes (Signed)
Pt present for annual exam. Pt's last pap 01/05/16 results normal. Pt stated stated that she was doing well denies any issues at this time. Flu vaccine administered and documented.

## 2019-03-10 NOTE — Patient Instructions (Signed)
Preventive Care 21-26 Years Old, Female Preventive care refers to visits with your health care provider and lifestyle choices that can promote health and wellness. This includes:  A yearly physical exam. This may also be called an annual well check.  Regular dental visits and eye exams.  Immunizations.  Screening for certain conditions.  Healthy lifestyle choices, such as eating a healthy diet, getting regular exercise, not using drugs or products that contain nicotine and tobacco, and limiting alcohol use. What can I expect for my preventive care visit? Physical exam Your health care provider will check your:  Height and weight. This may be used to calculate body mass index (BMI), which tells if you are at a healthy weight.  Heart rate and blood pressure.  Skin for abnormal spots. Counseling Your health care provider may ask you questions about your:  Alcohol, tobacco, and drug use.  Emotional well-being.  Home and relationship well-being.  Sexual activity.  Eating habits.  Work and work environment.  Method of birth control.  Menstrual cycle.  Pregnancy history. What immunizations do I need?  Influenza (flu) vaccine  This is recommended every year. Tetanus, diphtheria, and pertussis (Tdap) vaccine  You may need a Td booster every 10 years. Varicella (chickenpox) vaccine  You may need this if you have not been vaccinated. Human papillomavirus (HPV) vaccine  If recommended by your health care provider, you may need three doses over 6 months. Measles, mumps, and rubella (MMR) vaccine  You may need at least one dose of MMR. You may also need a second dose. Meningococcal conjugate (MenACWY) vaccine  One dose is recommended if you are age 19-21 years and a first-year college student living in a residence hall, or if you have one of several medical conditions. You may also need additional booster doses. Pneumococcal conjugate (PCV13) vaccine  You may need  this if you have certain conditions and were not previously vaccinated. Pneumococcal polysaccharide (PPSV23) vaccine  You may need one or two doses if you smoke cigarettes or if you have certain conditions. Hepatitis A vaccine  You may need this if you have certain conditions or if you travel or work in places where you may be exposed to hepatitis A. Hepatitis B vaccine  You may need this if you have certain conditions or if you travel or work in places where you may be exposed to hepatitis B. Haemophilus influenzae type b (Hib) vaccine  You may need this if you have certain conditions. You may receive vaccines as individual doses or as more than one vaccine together in one shot (combination vaccines). Talk with your health care provider about the risks and benefits of combination vaccines. What tests do I need?  Blood tests  Lipid and cholesterol levels. These may be checked every 5 years starting at age 20.  Hepatitis C test.  Hepatitis B test. Screening  Diabetes screening. This is done by checking your blood sugar (glucose) after you have not eaten for a while (fasting).  Sexually transmitted disease (STD) testing.  BRCA-related cancer screening. This may be done if you have a family history of breast, ovarian, tubal, or peritoneal cancers.  Pelvic exam and Pap test. This may be done every 3 years starting at age 21. Starting at age 30, this may be done every 5 years if you have a Pap test in combination with an HPV test. Talk with your health care provider about your test results, treatment options, and if necessary, the need for more tests.   Follow these instructions at home: Eating and drinking   Eat a diet that includes fresh fruits and vegetables, whole grains, lean protein, and low-fat dairy.  Take vitamin and mineral supplements as recommended by your health care provider.  Do not drink alcohol if: ? Your health care provider tells you not to drink. ? You are  pregnant, may be pregnant, or are planning to become pregnant.  If you drink alcohol: ? Limit how much you have to 0-1 drink a day. ? Be aware of how much alcohol is in your drink. In the [place] one drink equals one 12 oz bottle of beer (355 mL), one 5 oz glass of wine (148 mL), or one 1 oz glass of hard liquor (44 mL). Lifestyle  Take daily care of your teeth and gums.  Stay active. Exercise for at least 30 minutes on 5 or more days each week.  Do not use any products that contain nicotine or tobacco, such as cigarettes, e-cigarettes, and chewing tobacco. If you need help quitting, ask your health care provider.  If you are sexually active, practice safe sex. Use a condom or other form of birth control (contraception) in order to prevent pregnancy and STIs (sexually transmitted infections). If you plan to become pregnant, see your health care provider for a preconception visit. What's next?  Visit your health care provider once a year for a well check visit.  Ask your health care provider how often you should have your eyes and teeth checked.  Stay up to date on all vaccines. This information is not intended to replace advice given to you by your health care provider. Make sure you discuss any questions you have with your health care provider. Document Released: 05/23/2001 Document Revised: 12/06/2017 Document Reviewed: 12/06/2017 Elsevier Patient Education  2020 [place] self-awareness is knowing how your breasts look and feel. Doing breast self-awareness is important. It allows you to catch a breast problem early while it is still small and can be treated. All women should do breast self-awareness, including women who have had breast implants. Tell your doctor if you notice a change in your breasts. What you need:  A mirror.  A well-lit room. How to do a breast self-exam A breast self-exam is one way to learn what is normal for your breasts and to  check for changes. To do a breast self-exam: Look for changes  1. Take off all the clothes above your waist. 2. Stand in front of a mirror in a room with good lighting. 3. Put your hands on your hips. 4. Push your hands down. 5. Look at your breasts and nipples in the mirror to see if one breast or nipple looks different from the other. Check to see if: ? The shape of one breast is different. ? The size of one breast is different. ? There are wrinkles, dips, and bumps in one breast and not the other. 6. Look at each breast for changes in the skin, such as: ? Redness. ? Scaly areas. 7. Look for changes in your nipples, such as: ? Liquid around the nipples. ? Bleeding. ? Dimpling. ? Redness. ? A change in where the nipples are. Feel for changes  1. Lie on your back on the floor. 2. Feel each breast. To do this, follow these steps: ? Pick a breast to feel. ? Put the arm closest to that breast above your head. ? Use your other arm to feel the nipple area of  your breast. Feel the area with the pads of your three middle fingers by making small circles with your fingers. For the first circle, press lightly. For the second circle, press harder. For the third circle, press even harder. ? Keep making circles with your fingers at the different pressures as you move down your breast. Stop when you feel your ribs. ? Move your fingers a little toward the center of your body. ? Start making circles with your fingers again, this time going up until you reach your collarbone. ? Keep making up-and-down circles until you reach your armpit. Remember to keep using the three pressures. ? Feel the other breast in the same way. 3. Sit or stand in the tub or shower. 4. With soapy water on your skin, feel each breast the same way you did in step 2 when you were lying on the floor. Write down what you find Writing down what you find can help you remember what to tell your doctor. Write down:  What is  normal for each breast.  Any changes you find in each breast, including: ? The kind of changes you find. ? Whether you have pain. ? Size and location of any lumps.  When you last had your menstrual period. General tips  Check your breasts every month.  If you are breastfeeding, the best time to check your breasts is after you feed your baby or after you use a breast pump.  If you get menstrual periods, the best time to check your breasts is 5-7 days after your menstrual period is over.  With time, you will become comfortable with the self-exam, and you will begin to know if there are changes in your breasts. Contact a doctor if you:  See a change in the shape or size of your breasts or nipples.  See a change in the skin of your breast or nipples, such as red or scaly skin.  Have fluid coming from your nipples that is not normal.  Find a lump or thick area that was not there before.  Have pain in your breasts.  Have any concerns about your breast health. Summary  Breast self-awareness includes looking for changes in your breasts, as well as feeling for changes within your breasts.  Breast self-awareness should be done in front of a mirror in a well-lit room.  You should check your breasts every month. If you get menstrual periods, the best time to check your breasts is 5-7 days after your menstrual period is over.  Let your doctor know of any changes you see in your breasts, including changes in size, changes on the skin, pain or tenderness, or fluid from your nipples that is not normal. This information is not intended to replace advice given to you by your health care provider. Make sure you discuss any questions you have with your health care provider. Document Released: 09/13/2007 Document Revised: 11/13/2017 Document Reviewed: 11/13/2017 Elsevier Patient Education  2020 [employer].

## 2019-03-11 ENCOUNTER — Other Ambulatory Visit (HOSPITAL_COMMUNITY)
Admission: RE | Admit: 2019-03-11 | Discharge: 2019-03-11 | Disposition: A | Discharge status: Home / Self Care | Payer: 59 | Source: Ambulatory Visit | Attending: Obstetrics and Gynecology | Admitting: Obstetrics and Gynecology

## 2019-03-11 ENCOUNTER — Encounter: Payer: Self-pay | Admitting: Obstetrics and Gynecology

## 2019-03-11 ENCOUNTER — Ambulatory Visit (INDEPENDENT_AMBULATORY_CARE_PROVIDER_SITE_OTHER): Payer: 59 | Admitting: Obstetrics and Gynecology

## 2019-03-11 ENCOUNTER — Other Ambulatory Visit: Payer: Self-pay

## 2019-03-11 VITALS — BP 98/61 | HR 83 | Ht 64.0 in | Wt 185.4 lb

## 2019-03-11 DIAGNOSIS — Z01419 Encounter for gynecological examination (general) (routine) without abnormal findings: Secondary | ICD-10-CM | POA: Diagnosis present

## 2019-03-11 DIAGNOSIS — Z13220 Encounter for screening for lipoid disorders: Secondary | ICD-10-CM

## 2019-03-11 DIAGNOSIS — Z23 Encounter for immunization: Secondary | ICD-10-CM | POA: Diagnosis not present

## 2019-03-11 DIAGNOSIS — E669 Obesity, unspecified: Secondary | ICD-10-CM

## 2019-03-11 NOTE — Progress Notes (Signed)
GYNECOLOGY ANNUAL PHYSICAL EXAM PROGRESS NOTE  Subjective:    Jaime Mcintosh is a 28 y.o. G42P1001 female who presents for an annual exam. She has no complaints today. She is sexually active.  The patient wears seatbelts: yes. The patient participates in regular exercise: no. Has the patient ever been transfused or tattooed?: yes (professionally done). Jaime Mcintosh reports that there is not domestic violence in her life.     Gynecologic History  Menarche age:  5 Patient's last menstrual period was 02/17/2019. Contraception: none History of STI's: Denies Last Pap: 01/05/2016. Results were: normal.  Denies h/o abnormal pap smears.    OB History  Gravida Para Term Preterm AB Living  1 1 1  0 0 1  SAB TAB Ectopic Multiple Live Births  0 0 0 0 1    # Outcome Date GA Lbr Len/2nd Weight Sex Delivery Anes PTL Lv  1 Term 07/05/16 [redacted]w[redacted]d  9 lb 6.6 oz (4.27 kg) M Vag-Spont EPI  LIV     Name: Jaime Mcintosh     Apgar1: 2  Apgar5: 7    Past Medical History:  Diagnosis Date  . Headache    hx migraines  . History of pre-eclampsia 06/2016   mild    Past Surgical History:  Procedure Laterality Date  . NO PAST SURGERIES      Family History  Problem Relation Age of Onset  . Heart disease Maternal Grandfather   . Diabetes Paternal Grandfather   . Arthritis Maternal Grandmother   . Hypertension Maternal Grandmother   . Varicose Veins Maternal Grandmother   . Vision loss Paternal Grandmother   . Healthy Mother   . Healthy Father   . Stroke Neg Hx     Social History   Socioeconomic History  . Marital status: Married    Spouse name: Not on file  . Number of children: Not on file  . Years of education: Not on file  . Highest education level: Not on file  Occupational History  . Not on file  Social Needs  . Financial resource strain: Not on file  . Food insecurity    Worry: Not on file    Inability: Not on file  . Transportation needs    Medical: Not on file   Non-medical: Not on file  Tobacco Use  . Smoking status: Never Smoker  . Smokeless tobacco: Never Used  Substance and Sexual Activity  . Alcohol use: Yes    Comment: occass  . Drug use: No  . Sexual activity: Yes    Partners: Female    Birth control/protection: None  Lifestyle  . Physical activity    Days per week: Not on file    Minutes per session: Not on file  . Stress: Not on file  Relationships  . Social 07/2016 on phone: Not on file    Gets together: Not on file    Attends religious service: Not on file    Active member of club or organization: Not on file    Attends meetings of clubs or organizations: Not on file    Relationship status: Not on file  . Intimate partner violence    Fear of current or ex partner: Not on file    Emotionally abused: Not on file    Physically abused: Not on file    Forced sexual activity: Not on file  Other Topics Concern  . Not on file  Social History Narrative   Works nights  No current outpatient medications on file prior to visit.   No current facility-administered medications on file prior to visit.     No Known Allergies    Review of Systems Constitutional: negative for chills, fatigue, fevers and sweats Eyes: negative for irritation, redness and visual disturbance Ears, nose, mouth, throat, and face: negative for hearing loss, nasal congestion, snoring and tinnitus Respiratory: negative for asthma, cough, sputum Cardiovascular: negative for chest pain, dyspnea, exertional chest pressure/discomfort, irregular heart beat, palpitations and syncope Gastrointestinal: negative for abdominal pain, change in bowel habits, nausea and vomiting Genitourinary: negative for abnormal menstrual periods, genital lesions, sexual problems and vaginal discharge, dysuria and urinary incontinence Integument/breast: negative for breast lump, breast tenderness and nipple discharge Hematologic/lymphatic: negative for bleeding and  easy bruising Musculoskeletal:negative for back pain and muscle weakness Neurological: negative for dizziness, headaches, vertigo and weakness Endocrine: negative for diabetic symptoms including polydipsia, polyuria and skin dryness Allergic/Immunologic: negative for hay fever and urticaria       Objective:  Blood pressure 98/61, pulse 83, height 5\' 4"  (1.626 m), weight 185 lb 6.4 oz (84.1 kg), last menstrual period 02/17/2019, currently breastfeeding. Body mass index is 31.82 kg/m.  General Appearance:    Alert, cooperative, no distress, appears stated age, mild obesity  Head:    Normocephalic, without obvious abnormality, atraumatic  Eyes:    PERRL, conjunctiva/corneas clear, EOM's intact, both eyes  Ears:    Normal external ear canals, both ears  Nose:   Nares normal, septum midline, mucosa normal, no drainage or sinus tenderness  Throat:   Lips, mucosa, and tongue normal; teeth and gums normal  Neck:   Supple, symmetrical, trachea midline, no adenopathy; thyroid: no enlargement/tenderness/nodules; no carotid bruit or JVD  Back:     Symmetric, no curvature, ROM normal, no CVA tenderness  Lungs:     Clear to auscultation bilaterally, respirations unlabored  Chest Wall:    No tenderness or deformity   Heart:    Regular rate and rhythm, S1 and S2 normal, no murmur, rub or gallop  Breast Exam:    No tenderness, masses, or nipple abnormality  Abdomen:     Soft, non-tender, bowel sounds active all four quadrants, no masses, no organomegaly.    Genitalia:    Pelvic:external genitalia normal, vagina without lesions, discharge, or tenderness, rectovaginal septum  normal. Cervix normal in appearance, no cervical motion tenderness, no adnexal masses or tenderness.  Uterus normal size, shape, mobile, regular contours, nontender.  Rectal:    Normal external sphincter.  No hemorrhoids appreciated. Internal exam not done.   Extremities:   Extremities normal, atraumatic, no cyanosis or edema  Pulses:    2+ and symmetric all extremities  Skin:   Skin color, texture, turgor normal, no rashes or lesions  Lymph nodes:   Cervical, supraclavicular, and axillary nodes normal  Neurologic:   CNII-XII intact, normal strength, sensation and reflexes throughout    Labs:  Lab Results  Component Value Date   WBC 8.9 03/04/2018   HGB 13.4 03/04/2018   HCT 40.3 03/04/2018   MCV 86 03/04/2018   PLT 478 (H) 03/04/2018    Lab Results  Component Value Date   CREATININE 0.84 03/04/2018   BUN 12 03/04/2018   NA 141 03/04/2018   K 4.5 03/04/2018   CL 105 03/04/2018   CO2 23 03/04/2018    Lab Results  Component Value Date   ALT 14 03/04/2018   AST 15 03/04/2018   ALKPHOS 73 03/04/2018   BILITOT  0.8 03/04/2018    Lab Results  Component Value Date   TSH 0.58 04/28/2013     Assessment:   1. Encounter for well woman exam with routine gynecological exam 2. Flu vaccine need 3. Obesity, mild  Plan:    - Blood tests: CBC with diff, Comprehensive metabolic panel, Lipid panel, and TSH. - Breast self exam technique reviewed and patient encouraged to perform self-exam monthly. - Contraception: None currently. Declines.  - Discussed healthy lifestyle modifications. - Pap smear performed today.  - Flu vaccine given today.  - Return in 1 year for annual exam.     Rubie Maid, MD [facility]

## 2019-03-11 NOTE — Addendum Note (Signed)
Addended by: Edwyna Shell on: 03/11/2019 09:07 AM   Modules accepted: Orders

## 2019-03-12 LAB — COMPREHENSIVE METABOLIC PANEL
ALT: 8 IU/L (ref 0–32)
AST: 9 IU/L (ref 0–40)
Albumin/Globulin Ratio: 2 (ref 1.2–2.2)
Albumin: 4.4 g/dL (ref 3.9–5.0)
Alkaline Phosphatase: 70 IU/L (ref 39–117)
BUN/Creatinine Ratio: 15 (ref 9–23)
BUN: 12 mg/dL (ref 6–20)
Bilirubin Total: 0.9 mg/dL (ref 0.0–1.2)
CO2: 24 mmol/L (ref 20–29)
Calcium: 9 mg/dL (ref 8.7–10.2)
Chloride: 104 mmol/L (ref 96–106)
Creatinine, Ser: 0.8 mg/dL (ref 0.57–1.00)
GFR calc Af Amer: 118 mL/min/{1.73_m2} (ref >59)
GFR calc non Af Amer: 102 mL/min/{1.73_m2} (ref >59)
Globulin, Total: 2.2 g/dL (ref 1.5–4.5)
Glucose: 83 mg/dL (ref 65–99)
Potassium: 4.4 mmol/L (ref 3.5–5.2)
Sodium: 141 mmol/L (ref 134–144)
Total Protein: 6.6 g/dL (ref 6.0–8.5)

## 2019-03-12 LAB — CBC WITH DIFFERENTIAL/PLATELET
Basophils Absolute: 0.1 10*3/uL (ref 0.0–0.2)
Basos: 1 %
EOS (ABSOLUTE): 0.1 10*3/uL (ref 0.0–0.4)
Eos: 1 %
Hematocrit: 40.7 % (ref 34.0–46.6)
Hemoglobin: 13.8 g/dL (ref 11.1–15.9)
Immature Grans (Abs): 0 10*3/uL (ref 0.0–0.1)
Immature Granulocytes: 0 %
Lymphocytes Absolute: 2.3 10*3/uL (ref 0.7–3.1)
Lymphs: 27 %
MCH: 30.1 pg (ref 26.6–33.0)
MCHC: 33.9 g/dL (ref 31.5–35.7)
MCV: 89 fL (ref 79–97)
Monocytes Absolute: 0.5 10*3/uL (ref 0.1–0.9)
Monocytes: 6 %
Neutrophils Absolute: 5.4 10*3/uL (ref 1.4–7.0)
Neutrophils: 65 %
Platelets: 390 10*3/uL (ref 150–450)
RBC: 4.59 x10E6/uL (ref 3.77–5.28)
RDW: 12.3 % (ref 11.7–15.4)
WBC: 8.4 10*3/uL (ref 3.4–10.8)

## 2019-03-12 LAB — CYTOLOGY - PAP: Diagnosis: NEGATIVE

## 2019-03-12 LAB — LIPID PANEL
Chol/HDL Ratio: 3.4 ratio (ref 0.0–4.4)
Cholesterol, Total: 195 mg/dL (ref 100–199)
HDL: 58 mg/dL (ref >39)
LDL Chol Calc (NIH): 114 mg/dL — ABNORMAL HIGH (ref 0–99)
Triglycerides: 128 mg/dL (ref 0–149)
VLDL Cholesterol Cal: 23 mg/dL (ref 5–40)

## 2019-03-12 LAB — TSH: TSH: 1.42 u[IU]/mL (ref 0.450–4.500)

## 2020-01-13 NOTE — Progress Notes (Signed)
PT is present today for confirmation of pregnancy. Pt LMP 12/01/2019. UPT done today results were positive. Pt stated that she is doing well no complaints.

## 2020-01-13 NOTE — Patient Instructions (Addendum)
WHAT OB PATIENTS CAN EXPECT   Confirmation of pregnancy and ultrasound ordered if medically indicated-[redacted] weeks gestation  New OB (NOB) intake with nurse and New OB (NOB) labs- [redacted] weeks gestation  New OB (NOB) physical examination with provider- 11/[redacted] weeks gestation  Flu vaccine-[redacted] weeks gestation  Anatomy scan-[redacted] weeks gestation  Glucose tolerance test, blood work to test for anemia, T-dap vaccine-[redacted] weeks gestation  Vaginal swabs/cultures-STD/Group B strep-[redacted] weeks gestation  Appointments every 4 weeks until 28 weeks  Every 2 weeks from 28 weeks until 36 weeks  Weekly visits from 36 weeks until delivery  Morning Sickness  Morning sickness is when you feel sick to your stomach (nauseous) during pregnancy. You may feel sick to your stomach and throw up (vomit). You may feel sick in the morning, but you can feel this way at any time of day. Some women feel very sick to their stomach and cannot stop throwing up (hyperemesis gravidarum). Follow these instructions at home: Medicines  Take over-the-counter and prescription medicines only as told by your doctor. Do not take any medicines until you talk with your doctor about them first.  Taking multivitamins before getting pregnant can stop or lessen the harshness of morning sickness. Eating and drinking  Eat dry toast or crackers before getting out of bed.  Eat 5 or 6 small meals a day.  Eat dry and bland foods like rice and baked potatoes.  Do not eat greasy, fatty, or spicy foods.  Have someone cook for you if the smell of food causes you to feel sick or throw up.  If you feel sick to your stomach after taking prenatal vitamins, take them at night or with a snack.  Eat protein when you need a snack. Nuts, yogurt, and cheese are good choices.  Drink fluids throughout the day.  Try ginger ale made with real ginger, ginger tea made from fresh grated ginger, or ginger candies. General instructions  Do not use any products  that have nicotine or tobacco in them, such as cigarettes and e-cigarettes. If you need help quitting, ask your doctor.  Use an air purifier to keep the air in your house free of smells.  Get lots of fresh air.  Try to avoid smells that make you feel sick.  Try: ? Wearing a bracelet that is used for seasickness (acupressure wristband). ? Going to a doctor who puts thin needles into certain body points (acupuncture) to improve how you feel. Contact a doctor if:  You need medicine to feel better.  You feel dizzy or light-headed.  You are losing weight. Get help right away if:  You feel very sick to your stomach and cannot stop throwing up.  You pass out (faint).  You have very bad pain in your belly. Summary  Morning sickness is when you feel sick to your stomach (nauseous) during pregnancy.  You may feel sick in the morning, but you can feel this way at any time of day.  Making some changes to what you eat may help your symptoms go away. This information is not intended to replace advice given to you by your health care provider. Make sure you discuss any questions you have with your health care provider. Document Revised: 03/09/2017 Document Reviewed: 04/27/2016 Elsevier Patient Education  2020 [employer] Commonly Asked Questions During Pregnancy  Cats: A parasite can be excreted in cat feces.  To avoid exposure you need to have another person empty the little box.  If you must empty   the litter box you will need to wear gloves.  Wash your hands after handling your cat.  This parasite can also be found in raw or undercooked meat so this should also be avoided.  Colds, Sore Throats, Flu: Please check your medication sheet to see what you can take for symptoms.  If your symptoms are unrelieved by these medications please call the office.  Dental Work: Most any dental work your dentist recommends is permitted.  X-rays should only be taken during the first trimester if  absolutely necessary.  Your abdomen should be shielded with a lead apron during all x-rays.  Please notify your provider prior to receiving any x-rays.  Novocaine is fine; gas is not recommended.  If your dentist requires a note from us prior to dental work please call the office and we will provide one for you.  Exercise: Exercise is an important part of staying healthy during your pregnancy.  You may continue most exercises you were accustomed to prior to pregnancy.  Later in your pregnancy you will most likely notice you have difficulty with activities requiring balance like riding a bicycle.  It is important that you listen to your body and avoid activities that put you at a higher risk of falling.  Adequate rest and staying well hydrated are a must!  If you have questions about the safety of specific activities ask your provider.    Exposure to Children with illness: Try to avoid obvious exposure; report any symptoms to us when noted,  If you have chicken pos, red measles or mumps, you should be immune to these diseases.   Please do not take any vaccines while pregnant unless you have checked with your OB provider.  Fetal Movement: After 28 weeks we recommend you do "kick counts" twice daily.  Lie or sit down in a calm quiet environment and count your baby movements "kicks".  You should feel your baby at least 10 times per hour.  If you have not felt 10 kicks within the first hour get up, walk around and have something sweet to eat or drink then repeat for an additional hour.  If count remains less than 10 per hour notify your provider.  Fumigating: Follow your pest control agent's advice as to how long to stay out of your home.  Ventilate the area well before re-entering.  Hemorrhoids:   Most over-the-counter preparations can be used during pregnancy.  Check your medication to see what is safe to use.  It is important to use a stool softener or fiber in your diet and to drink lots of liquids.  If  hemorrhoids seem to be getting worse please call the office.   Hot Tubs:  Hot tubs Jacuzzis and saunas are not recommended while pregnant.  These increase your internal body temperature and should be avoided.  Intercourse:  Sexual intercourse is safe during pregnancy as long as you are comfortable, unless otherwise advised by your provider.  Spotting may occur after intercourse; report any bright red bleeding that is heavier than spotting.  Labor:  If you know that you are in labor, please go to the hospital.  If you are unsure, please call the office and let us help you decide what to do.  Lifting, straining, etc:  If your job requires heavy lifting or straining please check with your provider for any limitations.  Generally, you should not lift items heavier than that you can lift simply with your hands and arms (no back muscles)    Painting:  Paint fumes do not harm your pregnancy, but may make you ill and should be avoided if possible.  Latex or water based paints have less odor than oils.  Use adequate ventilation while painting.  Permanents & Hair Color:  Chemicals in hair dyes are not recommended as they cause increase hair dryness which can increase hair loss during pregnancy.  " Highlighting" and permanents are allowed.  Dye may be absorbed differently and permanents may not hold as well during pregnancy.  Sunbathing:  Use a sunscreen, as skin burns easily during pregnancy.  Drink plenty of fluids; avoid over heating.  Tanning Beds:  Because their possible side effects are still unknown, tanning beds are not recommended.  Ultrasound Scans:  Routine ultrasounds are performed at approximately 20 weeks.  You will be able to see your baby's general anatomy an if you would like to know the gender this can usually be determined as well.  If it is questionable when you conceived you may also receive an ultrasound early in your pregnancy for dating purposes.  Otherwise ultrasound exams are not  routinely performed unless there is a medical necessity.  Although you can request a scan we ask that you pay for it when conducted because insurance does not cover " patient request" scans.  Work: If your pregnancy proceeds without complications you may work until your due date, unless your physician or employer advises otherwise.  Round Ligament Pain/Pelvic Discomfort:  Sharp, shooting pains not associated with bleeding are fairly common, usually occurring in the second trimester of pregnancy.  They tend to be worse when standing up or when you remain standing for long periods of time.  These are the result of pressure of certain pelvic ligaments called "round ligaments".  Rest, Tylenol and heat seem to be the most effective relief.  As the womb and fetus grow, they rise out of the pelvis and the discomfort improves.  Please notify the office if your pain seems different than that described.  It may represent a more serious condition.  Common Medications Safe in Pregnancy  Acne:      Constipation:  Benzoyl Peroxide     Colace  Clindamycin      Dulcolax Suppository  Topica Erythromycin     Fibercon  Salicylic Acid      Metamucil         Miralax AVOID:        Senakot   Accutane    Cough:  Retin-A       Cough Drops  Tetracycline      Phenergan w/ Codeine if Rx  Minocycline      Robitussin (Plain & DM)  Antibiotics:     Crabs/Lice:  Ceclor       RID  Cephalosporins    AVOID:  E-Mycins      Kwell  Keflex  Macrobid/Macrodantin   Diarrhea:  Penicillin      Kao-Pectate  Zithromax      Imodium AD         PUSH FLUIDS AVOID:       Cipro     Fever:  Tetracycline      Tylenol (Regular or Extra  Minocycline       Strength)  Levaquin      Extra Strength-Do not          Exceed 8 tabs/24 hrs Caffeine:        <200mg/day (equiv. To 1 cup of coffee or  approx. 3 12 oz  sodas)           Gas: Cold/Hayfever:       Gas-X  Benadryl      Mylicon  Claritin       Phazyme  **Claritin-D        Chlor-Trimeton    Headaches:  Dimetapp      ASA-Free Excedrin  Drixoral-Non-Drowsy     Cold Compress  Mucinex (Guaifenasin)     Tylenol (Regular or Extra  Sudafed/Sudafed-12 Hour     Strength)  **Sudafed PE Pseudoephedrine   Tylenol Cold & Sinus     Vicks Vapor Rub  Zyrtec  **AVOID if Problems With Blood Pressure         Heartburn: Avoid lying down for at least 1 hour after meals  Aciphex      Maalox     Rash:  Milk of Magnesia     Benadryl    Mylanta       1% Hydrocortisone Cream  Pepcid  Pepcid Complete   Sleep Aids:  Prevacid      Ambien   Prilosec       Benadryl  Rolaids       Chamomile Tea  Tums (Limit 4/day)     Unisom         Tylenol PM         Warm milk-add vanilla or  Hemorrhoids:       Sugar for taste  Anusol/Anusol H.C.  (RX: Analapram 2.5%)  Sugar Substitutes:  Hydrocortisone OTC     Ok in moderation  Preparation H      Tucks        Vaseline lotion applied to tissue with wiping    Herpes:     Throat:  Acyclovir      Oragel  Famvir  Valtrex     Vaccines:         Flu Shot Leg Cramps:       *Gardasil  Benadryl      Hepatitis A         Hepatitis B Nasal Spray:       Pneumovax  Saline Nasal Spray     Polio Booster         Tetanus Nausea:       Tuberculosis test or PPD  Vitamin B6 25 mg TID   AVOID:    Dramamine      *Gardasil  Emetrol       Live Poliovirus  Ginger Root 250 mg QID    MMR (measles, mumps &  High Complex Carbs @ Bedtime    rebella)  Sea Bands-Accupressure    Varicella (Chickenpox)  Unisom 1/2 tab TID     *No known complications           If received before Pain:         Known pregnancy;   Darvocet       Resume series after  Lortab        Delivery  Percocet    Yeast:   Tramadol      Femstat  Tylenol 3      Gyne-lotrimin  Ultram       Monistat  Vicodin           MISC:         All Sunscreens           Hair  Coloring/highlights          Insect Repellant's          (Including DEET)         Mystic Tans    Including DEET)         Mystic Tans

## 2020-01-14 ENCOUNTER — Ambulatory Visit (INDEPENDENT_AMBULATORY_CARE_PROVIDER_SITE_OTHER): Payer: BC Managed Care – PPO | Admitting: Obstetrics and Gynecology

## 2020-01-14 ENCOUNTER — Other Ambulatory Visit: Payer: Self-pay

## 2020-01-14 ENCOUNTER — Encounter: Payer: 59 | Admitting: Obstetrics and Gynecology

## 2020-01-14 ENCOUNTER — Encounter: Payer: Self-pay | Admitting: Obstetrics and Gynecology

## 2020-01-14 VITALS — BP 106/72 | HR 76 | Ht 64.0 in | Wt 187.3 lb

## 2020-01-14 DIAGNOSIS — N926 Irregular menstruation, unspecified: Secondary | ICD-10-CM | POA: Diagnosis not present

## 2020-01-14 DIAGNOSIS — O26859 Spotting complicating pregnancy, unspecified trimester: Secondary | ICD-10-CM | POA: Diagnosis not present

## 2020-01-14 LAB — POCT URINE PREGNANCY: Preg Test, Ur: POSITIVE — AB

## 2020-01-14 NOTE — Progress Notes (Signed)
Subjective:    Jaime Mcintosh is a 29 y.o. G73P1001 female who presents for evaluation of amenorrhea. She believes she could be pregnant. Pregnancy is desired. Sexual Activity: none. Current symptoms also include: morning sickness and positive home pregnancy test. Last period was normal.  Patient's last menstrual period was 12/01/2019 (exact date).   Patient had been complaining of intermittent spotting for the past 2 weeks. Denies pelvic cramping.    The following portions of the patient's history were reviewed and updated as appropriate:  She  has a past medical history of Headache and History of pre-eclampsia (06/2016).   She  has a past surgical history that includes No past surgeries.   Her family history includes Arthritis in her maternal grandmother; Diabetes in her paternal grandfather; Healthy in her father and mother; Heart disease in her maternal grandfather; Hypertension in her maternal grandmother; Varicose Veins in her maternal grandmother; Vision loss in her paternal grandmother.   She  reports that she has never smoked. She has never used smokeless tobacco. She reports previous alcohol use. She reports that she does not use drugs.   Current Outpatient Medications on File Prior to Visit  Medication Sig Dispense Refill  . prenatal vitamin w/FE, FA (NATACHEW) 29-1 MG CHEW chewable tablet Chew 1 tablet by mouth daily at 12 noon.     No current facility-administered medications on file prior to visit.   She has No Known Allergies..  Review of Systems Pertinent items noted in HPI and remainder of comprehensive ROS otherwise negative.     Objective:    BP 106/72   Pulse 76   Ht 5\' 4"  (1.626 m)   Wt 187 lb 4.8 oz (85 kg)   LMP 12/01/2019 (Exact Date)   BMI 32.15 kg/m  General: alert, no distress and no acute distress    Lab Review Urine HCG: positive    Assessment:    Absence of menstruation.    Positive pregnancy test  Spotting in pregnancy Plan:   -  Pregnancy Test: Positive: EDC: 09/06/2020, with EGA 6.2 weeks. Briefly discussed pre-natal care options. Pregnancy, Childbirth and the Newborn book given. Encouraged well-balanced diet, plenty of rest when needed, pre-natal vitamins daily and walking for exercise. Discussed self-help for nausea, avoiding OTC medications until consulting provider or pharmacist, other than Tylenol as needed, minimal caffeine (1-2 cups daily) and avoiding alcohol. She will schedule her NOB intake in 1 week with ultrasound, and NOB physical in 5 weeks. Feel free to call with any questions.  - Discussed that spotting in pregnancy can be normal. Discussed bleeding precautions and when to return.     09/08/2020, MD [facility]

## 2020-01-20 ENCOUNTER — Telehealth: Payer: Self-pay

## 2020-01-20 NOTE — Telephone Encounter (Signed)
Called pt LMTRC. The pts appt for u/s on Thursday we need to move her appt due to overbooking and I left a message asking if she can come in on 01/21/20 at 2:15. I asked her to call back if that time works or call so we can resch. Her appt on Thursday

## 2020-01-22 ENCOUNTER — Other Ambulatory Visit: Payer: BC Managed Care – PPO

## 2020-01-26 ENCOUNTER — Other Ambulatory Visit: Payer: Self-pay

## 2020-01-26 ENCOUNTER — Ambulatory Visit (INDEPENDENT_AMBULATORY_CARE_PROVIDER_SITE_OTHER): Payer: BC Managed Care – PPO

## 2020-01-26 VITALS — BP 98/64 | HR 80 | Ht 64.0 in | Wt 185.4 lb

## 2020-01-26 DIAGNOSIS — N926 Irregular menstruation, unspecified: Secondary | ICD-10-CM

## 2020-01-26 DIAGNOSIS — O26859 Spotting complicating pregnancy, unspecified trimester: Secondary | ICD-10-CM

## 2020-01-26 DIAGNOSIS — Z3A01 Less than 8 weeks gestation of pregnancy: Secondary | ICD-10-CM

## 2020-01-26 DIAGNOSIS — Z3480 Encounter for supervision of other normal pregnancy, unspecified trimester: Secondary | ICD-10-CM | POA: Diagnosis not present

## 2020-01-26 DIAGNOSIS — Z8759 Personal history of other complications of pregnancy, childbirth and the puerperium: Secondary | ICD-10-CM

## 2020-01-26 DIAGNOSIS — Z202 Contact with and (suspected) exposure to infections with a predominantly sexual mode of transmission: Secondary | ICD-10-CM | POA: Diagnosis not present

## 2020-01-26 DIAGNOSIS — Z6831 Body mass index (BMI) 31.0-31.9, adult: Secondary | ICD-10-CM

## 2020-01-26 DIAGNOSIS — Z3A08 8 weeks gestation of pregnancy: Secondary | ICD-10-CM

## 2020-01-26 NOTE — Progress Notes (Signed)
Arthor Captain presents for NOB nurse interview visit. Pregnancy confirmation done 01/14/2020 [facility].  G- 2.  P- 1001. Dating scan today. 7 5/7. EDD -5/30/202.( per LMP) .  8 weeks.  Pregnancy education material explained and given. _0__ cats in the home. NOB labs ordered. TSH/HbgA1c due to Increased BMI-Body mass index is 31.82 kg/m.  HIV labs and Drug screen were explained  and she did not decline.  Consent form signed.  PNV encouraged. Genetic screening options discussed. Genetic testing: Unsure.  Pt may discuss with provider at NOB PE.  Pt. To follow up with provider in _4_ weeks for NOB physical.  Pos for nausea but no meds needed. Last pap smear 03/11/19 - neg. HX of preeclampsia in last pregnancy. FMLA form reviewed and signed.  Financial policy reviewed. All questions answered.

## 2020-01-26 NOTE — Patient Instructions (Signed)
WHAT OB PATIENTS CAN EXPECT   Confirmation of pregnancy and ultrasound ordered if medically indicated-[redacted] weeks gestation  New OB (NOB) intake with nurse and New OB (NOB) labs- [redacted] weeks gestation  New OB (NOB) physical examination with provider- 11/[redacted] weeks gestation  Flu vaccine-[redacted] weeks gestation  Anatomy scan-[redacted] weeks gestation  Glucose tolerance test, blood work to test for anemia, T-dap vaccine-[redacted] weeks gestation  Vaginal swabs/cultures-STD/Group B strep-[redacted] weeks gestation  Appointments every 4 weeks until 28 weeks  Every 2 weeks from 28 weeks until 36 weeks  Weekly visits from 36 weeks until delivery  Morning Sickness  Morning sickness is when you feel sick to your stomach (nauseous) during pregnancy. You may feel sick to your stomach and throw up (vomit). You may feel sick in the morning, but you can feel this way at any time of day. Some women feel very sick to their stomach and cannot stop throwing up (hyperemesis gravidarum). Follow these instructions at home: Medicines  Take over-the-counter and prescription medicines only as told by your doctor. Do not take any medicines until you talk with your doctor about them first.  Taking multivitamins before getting pregnant can stop or lessen the harshness of morning sickness. Eating and drinking  Eat dry toast or crackers before getting out of bed.  Eat 5 or 6 small meals a day.  Eat dry and bland foods like rice and baked potatoes.  Do not eat greasy, fatty, or spicy foods.  Have someone cook for you if the smell of food causes you to feel sick or throw up.  If you feel sick to your stomach after taking prenatal vitamins, take them at night or with a snack.  Eat protein when you need a snack. Nuts, yogurt, and cheese are good choices.  Drink fluids throughout the day.  Try ginger ale made with real ginger, ginger tea made from fresh grated ginger, or ginger candies. General instructions  Do not use any products  that have nicotine or tobacco in them, such as cigarettes and e-cigarettes. If you need help quitting, ask your doctor.  Use an air purifier to keep the air in your house free of smells.  Get lots of fresh air.  Try to avoid smells that make you feel sick.  Try: ? Wearing a bracelet that is used for seasickness (acupressure wristband). ? Going to a doctor who puts thin needles into certain body points (acupuncture) to improve how you feel. Contact a doctor if:  You need medicine to feel better.  You feel dizzy or light-headed.  You are losing weight. Get help right away if:  You feel very sick to your stomach and cannot stop throwing up.  You pass out (faint).  You have very bad pain in your belly. Summary  Morning sickness is when you feel sick to your stomach (nauseous) during pregnancy.  You may feel sick in the morning, but you can feel this way at any time of day.  Making some changes to what you eat may help your symptoms go away. This information is not intended to replace advice given to you by your health care provider. Make sure you discuss any questions you have with your health care provider. Document Revised: 03/09/2017 Document Reviewed: 04/27/2016 Elsevier Patient Education  2020 [employer]. How a Baby Grows During Pregnancy  Pregnancy begins when a female's sperm enters a female's egg (fertilization). Fertilization usually happens in one of the tubes (fallopian tubes) that connect the ovaries to the  womb (uterus). The fertilized egg moves down the fallopian tube to the uterus. Once it reaches the uterus, it implants into the lining of the uterus and begins to grow. For the first 10 weeks, the fertilized egg is called an embryo. After 10 weeks, it is called a fetus. As the fetus continues to grow, it receives oxygen and nutrients through tissue (placenta) that grows to support the developing baby. The placenta is the life support system for the baby. It provides  oxygen and nutrition and removes waste. Learning as much as you can about your pregnancy and how your baby is developing can help you enjoy the experience. It can also make you aware of when there might be a problem and when to ask questions. How long does a typical pregnancy last? A pregnancy usually lasts 280 days, or about 40 weeks. Pregnancy is divided into three periods of growth, also called trimesters:  First trimester: 0-12 weeks.  Second trimester: 13-27 weeks.  Third trimester: 28-40 weeks. The day when your baby is ready to be born (full term) is your estimated date of delivery. How does my baby develop month by month? First month  The fertilized egg attaches to the inside of the uterus.  Some cells will form the placenta. Others will form the fetus.  The arms, legs, brain, spinal cord, lungs, and heart begin to develop.  At the end of the first month, the heart begins to beat. Second month  The bones, inner ear, eyelids, hands, and feet form.  The genitals develop.  By the end of 8 weeks, all major organs are developing. Third month  All of the internal organs are forming.  Teeth develop below the gums.  Bones and muscles begin to grow. The spine can flex.  The skin is transparent.  Fingernails and toenails begin to form.  Arms and legs continue to grow longer, and hands and feet develop.  The fetus is about 3 inches (7.6 cm) long. Fourth month  The placenta is completely formed.  The external sex organs, neck, outer ear, eyebrows, eyelids, and fingernails are formed.  The fetus can hear, swallow, and move its arms and legs.  The kidneys begin to produce urine.  The skin is covered with a white, waxy coating (vernix) and very fine hair (lanugo). Fifth month  The fetus moves around more and can be felt for the first time (quickening).  The fetus starts to sleep and wake up and may begin to suck its finger.  The nails grow to the end of the  fingers.  The organ in the digestive system that makes bile (gallbladder) functions and helps to digest nutrients.  If your baby is a girl, eggs are present in her ovaries. If your baby is a boy, testicles start to move down into his scrotum. Sixth month  The lungs are formed.  The eyes open. The brain continues to develop.  Your baby has fingerprints and toe prints. Your baby's hair grows thicker.  At the end of the second trimester, the fetus is about 9 inches (22.9 cm) long. Seventh month  The fetus kicks and stretches.  The eyes are developed enough to sense changes in light.  The hands can make a grasping motion.  The fetus responds to sound. Eighth month  All organs and body systems are fully developed and functioning.  Bones harden, and taste buds develop. The fetus may hiccup.  Certain areas of the brain are still developing. The skull remains soft.   Ninth month  The fetus gains about  lb (0.23 kg) each week.  The lungs are fully developed.  Patterns of sleep develop.  The fetus's head typically moves into a head-down position (vertex) in the uterus to prepare for birth.  The fetus weighs 6-9 lb (2.72-4.08 kg) and is 19-20 inches (48.26-50.8 cm) long. What can I do to have a healthy pregnancy and help my baby develop? General instructions  Take prenatal vitamins as directed by your health care provider. These include vitamins such as folic acid, iron, calcium, and vitamin D. They are important for healthy development.  Take medicines only as directed by your health care provider. Read labels and ask a pharmacist or your health care provider whether over-the-counter medicines, supplements, and prescription drugs are safe to take during pregnancy.  Keep all follow-up visits as directed by your health care provider. This is important. Follow-up visits include prenatal care and screening tests. How do I know if my baby is developing well? At each prenatal visit,  your health care provider will do several different tests to check on your health and keep track of your baby's development. These include:  Fundal height and position. ? Your health care provider will measure your growing belly from your pubic bone to the top of the uterus using a tape measure. ? Your health care provider will also feel your belly to determine your baby's position.  Heartbeat. ? An ultrasound in the first trimester can confirm pregnancy and show a heartbeat, depending on how far along you are. ? Your health care provider will check your baby's heart rate at every prenatal visit.  Second trimester ultrasound. ? This ultrasound checks your baby's development. It also may show your baby's gender. What should I do if I have concerns about my baby's development? Always talk with your health care provider about any concerns that you may have about your pregnancy and your baby. Summary  A pregnancy usually lasts 280 days, or about 40 weeks. Pregnancy is divided into three periods of growth, also called trimesters.  Your health care provider will monitor your baby's growth and development throughout your pregnancy.  Follow your health care provider's recommendations about taking prenatal vitamins and medicines during your pregnancy.  Talk with your health care provider if you have any concerns about your pregnancy or your developing baby. This information is not intended to replace advice given to you by your health care provider. Make sure you discuss any questions you have with your health care provider. Document Revised: 07/18/2018 Document Reviewed: 02/07/2017 Elsevier Patient Education  2020 [place] of Pregnancy The first trimester of pregnancy is from week 1 until the end of week 13 (months 1 through 3). A week after a sperm fertilizes an egg, the egg will implant on the wall of the uterus. This embryo will begin to develop into a baby. Genes from you  and your partner will form the baby. The female genes will determine whether the baby will be a boy or a girl. At 6-8 weeks, the eyes and face will be formed, and the heartbeat can be seen on ultrasound. At the end of 12 weeks, all the baby's organs will be formed. Now that you are pregnant, you will want to do everything you can to have a healthy baby. Two of the most important things are to get good prenatal care and to follow your health care provider's instructions. Prenatal care is all the medical care you receive before  the baby's birth. This care will help prevent, find, and treat any problems during the pregnancy and childbirth. Body changes during your first trimester Your body goes through many changes during pregnancy. The changes vary from woman to woman.  You may gain or lose a couple of pounds at first.  You may feel sick to your stomach (nauseous) and you may throw up (vomit). If the vomiting is uncontrollable, call your health care provider.  You may tire easily.  You may develop headaches that can be relieved by medicines. All medicines should be approved by your health care provider.  You may urinate more often. Painful urination may mean you have a bladder infection.  You may develop heartburn as a result of your pregnancy.  You may develop constipation because certain hormones are causing the muscles that push stool through your intestines to slow down.  You may develop hemorrhoids or swollen veins (varicose veins).  Your breasts may begin to grow larger and become tender. Your nipples may stick out more, and the tissue that surrounds them (areola) may become darker.  Your gums may bleed and may be sensitive to brushing and flossing.  Dark spots or blotches (chloasma, mask of pregnancy) may develop on your face. This will likely fade after the baby is born.  Your menstrual periods will stop.  You may have a loss of appetite.  You may develop cravings for certain kinds  of food.  You may have changes in your emotions from day to day, such as being excited to be pregnant or being concerned that something may go wrong with the pregnancy and baby.  You may have more vivid and strange dreams.  You may have changes in your hair. These can include thickening of your hair, rapid growth, and changes in texture. Some women also have hair loss during or after pregnancy, or hair that feels dry or thin. Your hair will most likely return to normal after your baby is born. What to expect at prenatal visits During a routine prenatal visit:  You will be weighed to make sure you and the baby are growing normally.  Your blood pressure will be taken.  Your abdomen will be measured to track your baby's growth.  The fetal heartbeat will be listened to between weeks 10 and 14 of your pregnancy.  Test results from any previous visits will be discussed. Your health care provider may ask you:  How you are feeling.  If you are feeling the baby move.  If you have had any abnormal symptoms, such as leaking fluid, bleeding, severe headaches, or abdominal cramping.  If you are using any tobacco products, including cigarettes, chewing tobacco, and electronic cigarettes.  If you have any questions. Other tests that may be performed during your first trimester include:  Blood tests to find your blood type and to check for the presence of any previous infections. The tests will also be used to check for low iron levels (anemia) and protein on red blood cells (Rh antibodies). Depending on your risk factors, or if you previously had diabetes during pregnancy, you may have tests to check for high blood sugar that affects pregnant women (gestational diabetes).  Urine tests to check for infections, diabetes, or protein in the urine.  An ultrasound to confirm the proper growth and development of the baby.  Fetal screens for spinal cord problems (spina bifida) and Down  syndrome.  HIV (human immunodeficiency virus) testing. Routine prenatal testing includes screening for HIV, unless  you choose not to have this test.  You may need other tests to make sure you and the baby are doing well. Follow these instructions at home: Medicines  Follow your health care provider's instructions regarding medicine use. Specific medicines may be either safe or unsafe to take during pregnancy.  Take a prenatal vitamin that contains at least 600 micrograms (mcg) of folic acid.  If you develop constipation, try taking a stool softener if your health care provider approves. Eating and drinking   Eat a balanced diet that includes fresh fruits and vegetables, whole grains, good sources of protein such as meat, eggs, or tofu, and low-fat dairy. Your health care provider will help you determine the amount of weight gain that is right for you.  Avoid raw meat and uncooked cheese. These carry germs that can cause birth defects in the baby.  Eating four or five small meals rather than three large meals a day may help relieve nausea and vomiting. If you start to feel nauseous, eating a few soda crackers can be helpful. Drinking liquids between meals, instead of during meals, also seems to help ease nausea and vomiting.  Limit foods that are high in fat and processed sugars, such as fried and sweet foods.  To prevent constipation: ? Eat foods that are high in fiber, such as fresh fruits and vegetables, whole grains, and beans. ? Drink enough fluid to keep your urine clear or pale yellow. Activity  Exercise only as directed by your health care provider. Most women can continue their usual exercise routine during pregnancy. Try to exercise for 30 minutes at least 5 days a week. Exercising will help you: ? Control your weight. ? Stay in shape. ? Be prepared for labor and delivery.  Experiencing pain or cramping in the lower abdomen or lower back is a good sign that you should stop  exercising. Check with your health care provider before continuing with normal exercises.  Try to avoid standing for long periods of time. Move your legs often if you must stand in one place for a long time.  Avoid heavy lifting.  Wear low-heeled shoes and practice good posture.  You may continue to have sex unless your health care provider tells you not to. Relieving pain and discomfort  Wear a good support bra to relieve breast tenderness.  Take warm sitz baths to soothe any pain or discomfort caused by hemorrhoids. Use hemorrhoid cream if your health care provider approves.  Rest with your legs elevated if you have leg cramps or low back pain.  If you develop varicose veins in your legs, wear support hose. Elevate your feet for 15 minutes, 3-4 times a day. Limit salt in your diet. Prenatal care  Schedule your prenatal visits by the twelfth week of pregnancy. They are usually scheduled monthly at first, then more often in the last 2 months before delivery.  Write down your questions. Take them to your prenatal visits.  Keep all your prenatal visits as told by your health care provider. This is important. Safety  Wear your seat belt at all times when driving.  Make a list of emergency phone numbers, including numbers for family, friends, the hospital, and police and fire departments. General instructions  Ask your health care provider for a referral to a local prenatal education class. Begin classes no later than the beginning of month 6 of your pregnancy.  Ask for help if you have counseling or nutritional needs during pregnancy. Your health care  provider can offer advice or refer you to specialists for help with various needs.  Do not use hot tubs, steam rooms, or saunas.  Do not douche or use tampons or scented sanitary pads.  Do not cross your legs for long periods of time.  Avoid cat litter boxes and soil used by cats. These carry germs that can cause birth defects in  the baby and possibly loss of the fetus by miscarriage or stillbirth.  Avoid all smoking, herbs, alcohol, and medicines not prescribed by your health care provider. Chemicals in these products affect the formation and growth of the baby.  Do not use any products that contain nicotine or tobacco, such as cigarettes and e-cigarettes. If you need help quitting, ask your health care provider. You may receive counseling support and other resources to help you quit.  Schedule a dentist appointment. At home, brush your teeth with a soft toothbrush and be gentle when you floss. Contact a health care provider if:  You have dizziness.  You have mild pelvic cramps, pelvic pressure, or nagging pain in the abdominal area.  You have persistent nausea, vomiting, or diarrhea.  You have a bad smelling vaginal discharge.  You have pain when you urinate.  You notice increased swelling in your face, hands, legs, or ankles.  You are exposed to fifth disease or chickenpox.  You are exposed to [place] measles (rubella) and have never had it. Get help right away if:  You have a fever.  You are leaking fluid from your vagina.  You have spotting or bleeding from your vagina.  You have severe abdominal cramping or pain.  You have rapid weight gain or loss.  You vomit blood or material that looks like coffee grounds.  You develop a severe headache.  You have shortness of breath.  You have any kind of trauma, such as from a fall or a car accident. Summary  The first trimester of pregnancy is from week 1 until the end of week 13 (months 1 through 3).  Your body goes through many changes during pregnancy. The changes vary from woman to woman.  You will have routine prenatal visits. During those visits, your health care provider will examine you, discuss any test results you may have, and talk with you about how you are feeling. This information is not intended to replace advice given to you by your  health care provider. Make sure you discuss any questions you have with your health care provider. Document Revised: 03/09/2017 Document Reviewed: 03/08/2016 Elsevier Patient Education  [place]. Common Medications Safe in Pregnancy  Acne:      Constipation:  Benzoyl Peroxide     Colace  Clindamycin      Dulcolax Suppository  Topica Erythromycin     Fibercon  Salicylic Acid      Metamucil         Miralax AVOID:        Senakot   Accutane    Cough:  Retin-A       Cough Drops  Tetracycline      Phenergan w/ Codeine if Rx  Minocycline      Robitussin (Plain & DM)  Antibiotics:     Crabs/Lice:  Ceclor       RID  Cephalosporins    AVOID:  E-Mycins      Kwell  Keflex  Macrobid/Macrodantin   Diarrhea:  Penicillin      Kao-Pectate  Zithromax      Imodium AD  PUSH FLUIDS AVOID:       Cipro     Fever:  Tetracycline      Tylenol (Regular or Extra  Minocycline       Strength)  Levaquin      Extra Strength-Do not          Exceed 8 tabs/24 hrs Caffeine:        '200mg'$ /day (equiv. To 1 cup of coffee or  approx. 3 12 oz sodas)         Gas: Cold/Hayfever:       Gas-X  Benadryl      Mylicon  Claritin       Phazyme  **Claritin-D        Chlor-Trimeton    Headaches:  Dimetapp      ASA-Free Excedrin  Drixoral-Non-Drowsy     Cold Compress  Mucinex (Guaifenasin)     Tylenol (Regular or Extra  Sudafed/Sudafed-12 Hour     Strength)  **Sudafed PE Pseudoephedrine   Tylenol Cold & Sinus     Vicks Vapor Rub  Zyrtec  **AVOID if Problems With Blood Pressure         Heartburn: Avoid lying down for at least 1 hour after meals  Aciphex      Maalox     Rash:  Milk of Magnesia     Benadryl    Mylanta       1% Hydrocortisone Cream  Pepcid  Pepcid Complete   Sleep Aids:  Prevacid      Ambien   Prilosec       Benadryl  Rolaids       Chamomile Tea  Tums (Limit 4/day)     Unisom         Tylenol PM         Warm milk-add vanilla or  Hemorrhoids:       Sugar for  taste  Anusol/Anusol H.C.  (RX: Analapram 2.5%)  Sugar Substitutes:  Hydrocortisone OTC     Ok in moderation  Preparation H      Tucks        Vaseline lotion applied to tissue with wiping    Herpes:     Throat:  Acyclovir      Oragel  Famvir  Valtrex     Vaccines:         Flu Shot Leg Cramps:       *Gardasil  Benadryl      Hepatitis A         Hepatitis B Nasal Spray:       Pneumovax  Saline Nasal Spray     Polio Booster         Tetanus Nausea:       Tuberculosis test or PPD  Vitamin B6 25 mg TID   AVOID:    Dramamine      *Gardasil  Emetrol       Live Poliovirus  Ginger Root 250 mg QID    MMR (measles, mumps &  High Complex Carbs @ Bedtime    rebella)  Sea Bands-Accupressure    Varicella (Chickenpox)  Unisom 1/2 tab TID     *No known complications           If received before Pain:         Known pregnancy;   Darvocet       Resume series after  Lortab        Delivery  Percocet    Yeast:   Tramadol  Femstat  Tylenol 3      Gyne-lotrimin  Ultram       Monistat  Vicodin           MISC:         All Sunscreens           Hair Coloring/highlights          Insect Repellant's          (Including DEET)         [facility]  [place], [place], [place] [address]  Phone: [phone]   [facility] (second location)  [address] [place], [place] [address]  Phone: [phone]   [facility]) [place], [place], [place] [address] Phone: [phone]   [address] [place], [place] [address]  Phone: [phone]

## 2020-01-27 LAB — MONITOR DRUG PROFILE 14(MW)
Amphetamine Scrn, Ur: NEGATIVE ng/mL
BARBITURATE SCREEN URINE: NEGATIVE ng/mL
BENZODIAZEPINE SCREEN, URINE: NEGATIVE ng/mL
Buprenorphine, Urine: NEGATIVE ng/mL
CANNABINOIDS UR QL SCN: NEGATIVE ng/mL
Cocaine (Metab) Scrn, Ur: NEGATIVE ng/mL
Creatinine(Crt), U: 278.6 mg/dL (ref 20.0–300.0)
Fentanyl, Urine: NEGATIVE pg/mL
Meperidine Screen, Urine: NEGATIVE ng/mL
Methadone Screen, Urine: NEGATIVE ng/mL
OXYCODONE+OXYMORPHONE UR QL SCN: NEGATIVE ng/mL
Opiate Scrn, Ur: NEGATIVE ng/mL
Ph of Urine: 5.5 (ref 4.5–8.9)
Phencyclidine Qn, Ur: NEGATIVE ng/mL
Propoxyphene Scrn, Ur: NEGATIVE ng/mL
SPECIFIC GRAVITY: 1.027
Tramadol Screen, Urine: NEGATIVE ng/mL

## 2020-01-27 LAB — URINALYSIS, ROUTINE W REFLEX MICROSCOPIC
Bilirubin, UA: NEGATIVE
Glucose, UA: NEGATIVE
Ketones, UA: NEGATIVE
Nitrite, UA: NEGATIVE
RBC, UA: NEGATIVE
Specific Gravity, UA: 1.026 (ref 1.005–1.030)
Urobilinogen, Ur: 0.2 mg/dL (ref 0.2–1.0)
pH, UA: 5.5 (ref 5.0–7.5)

## 2020-01-27 LAB — MICROSCOPIC EXAMINATION: Casts: NONE SEEN /lpf

## 2020-01-28 LAB — CULTURE, OB URINE

## 2020-01-28 LAB — GC/CHLAMYDIA PROBE AMP
Chlamydia trachomatis, NAA: NEGATIVE
Neisseria Gonorrhoeae by PCR: NEGATIVE

## 2020-01-28 LAB — URINE CULTURE, OB REFLEX

## 2020-02-01 LAB — TSH: TSH: 3.57 u[IU]/mL (ref 0.450–4.500)

## 2020-02-01 LAB — RUBELLA SCREEN: Rubella Antibodies, IGG: 13.1 index (ref >0.99)

## 2020-02-01 LAB — HEMOGLOBIN A1C
Est. average glucose Bld gHb Est-mCnc: 103 mg/dL
Hgb A1c MFr Bld: 5.2 % (ref 4.8–5.6)

## 2020-02-01 LAB — ANTIBODY SCREEN: Antibody Screen: NEGATIVE

## 2020-02-01 LAB — HIV ANTIBODY (ROUTINE TESTING W REFLEX): HIV Screen 4th Generation wRfx: NONREACTIVE

## 2020-02-01 LAB — ABO AND RH: Rh Factor: NEGATIVE

## 2020-02-01 LAB — NICOTINE/COTININE METABOLITES
Cotinine: <1 ng/mL
Nicotine: <1 ng/mL

## 2020-02-01 LAB — RPR: RPR Ser Ql: NONREACTIVE

## 2020-02-01 LAB — VARICELLA ZOSTER ANTIBODY, IGG: Varicella zoster IgG: 228 index (ref >165)

## 2020-02-01 LAB — HEPATITIS B SURFACE ANTIGEN: Hepatitis B Surface Ag: NEGATIVE

## 2020-02-12 NOTE — Telephone Encounter (Signed)
Called pt to touch base with her on her ob payment plan. LMTRC

## 2020-02-18 ENCOUNTER — Encounter: Payer: Self-pay | Admitting: Obstetrics and Gynecology

## 2020-02-18 ENCOUNTER — Ambulatory Visit (INDEPENDENT_AMBULATORY_CARE_PROVIDER_SITE_OTHER): Payer: BC Managed Care – PPO | Admitting: Obstetrics and Gynecology

## 2020-02-18 ENCOUNTER — Other Ambulatory Visit: Payer: Self-pay

## 2020-02-18 VITALS — BP 114/69 | HR 99 | Ht 64.0 in | Wt 192.0 lb

## 2020-02-18 DIAGNOSIS — Z3481 Encounter for supervision of other normal pregnancy, first trimester: Secondary | ICD-10-CM

## 2020-02-18 DIAGNOSIS — E669 Obesity, unspecified: Secondary | ICD-10-CM

## 2020-02-18 DIAGNOSIS — Z8759 Personal history of other complications of pregnancy, childbirth and the puerperium: Secondary | ICD-10-CM | POA: Insufficient documentation

## 2020-02-18 DIAGNOSIS — Z3A11 11 weeks gestation of pregnancy: Secondary | ICD-10-CM

## 2020-02-18 DIAGNOSIS — O09299 Supervision of pregnancy with other poor reproductive or obstetric history, unspecified trimester: Secondary | ICD-10-CM

## 2020-02-18 DIAGNOSIS — Z8669 Personal history of other diseases of the nervous system and sense organs: Secondary | ICD-10-CM

## 2020-02-18 LAB — POCT URINALYSIS DIPSTICK OB
Bilirubin, UA: NEGATIVE
Blood, UA: NEGATIVE
Glucose, UA: NEGATIVE
Ketones, UA: NEGATIVE
Leukocytes, UA: NEGATIVE
Nitrite, UA: NEGATIVE
POC,PROTEIN,UA: NEGATIVE
Spec Grav, UA: 1.015 (ref 1.010–1.025)
Urobilinogen, UA: 0.2 E.U./dL
pH, UA: 7 (ref 5.0–8.0)

## 2020-02-18 MED ORDER — ASPIRIN EC 81 MG PO TBEC: 81.0000 mg | DELAYED_RELEASE_TABLET | Freq: Every day | ORAL | 2 refills | Status: DC

## 2020-02-18 NOTE — Progress Notes (Signed)
OBSTETRIC INITIAL PRENATAL VISIT  Subjective:    Jaime Mcintosh is being seen today for her first obstetrical visit.  This is a planned pregnancy. She is a 29 y.o. G2P1001 female at [redacted]w[redacted]d gestation, Estimated Date of Delivery: 09/06/20 with Patient's last menstrual period was 12/01/2019.,  consistent with 7 week sono. Her obstetrical history is significant for macrosomia and pre-eclampsia (mild) in prior pregnancy. Relationship with FOB: spouse, living together. Patient does intend to breast feed. Pregnancy history fully reviewed.    OB History  Gravida Para Term Preterm AB Living  2 1 1  0 0 1  SAB TAB Ectopic Multiple Live Births  0 0 0 0 1    # Outcome Date GA Lbr Len/2nd Weight Sex Delivery Anes PTL Lv  2 Current           1 Term 07/05/16 [redacted]w[redacted]d  9 lb 6.6 oz (4.27 kg) M Vag-Spont EPI  LIV     Name: Neidlinger,BOY Eowyn     Apgar1: 2  Apgar5: 7    Gynecologic History:  Last pap smear was 03/11/2019.  Results were normal.  Denies h/o abnormal pap smears in the past.  Denies history of STIs.  Contraception: None  Past Medical History:  Diagnosis Date  . Headache    hx migraines  . History of pre-eclampsia 06/2016   mild    Family History  Problem Relation Age of Onset  . Heart disease Maternal Grandfather   . Diabetes Paternal Grandfather   . Arthritis Maternal Grandmother   . Hypertension Maternal Grandmother   . Varicose Veins Maternal Grandmother   . Vision loss Paternal Grandmother   . Healthy Mother   . Healthy Father   . Stroke Neg Hx   . Breast cancer Neg Hx     Past Surgical History:  Procedure Laterality Date  . NO PAST SURGERIES      Social History   Socioeconomic History  . Marital status: Married    Spouse name: Not on file  . Number of children: Not on file  . Years of education: Not on file  . Highest education level: Not on file  Occupational History  . Not on file  Tobacco Use  . Smoking status: Never Smoker  . Smokeless tobacco: Never  Used  Vaping Use  . Vaping Use: Never used  Substance and Sexual Activity  . Alcohol use: Not Currently    Comment: occass  . Drug use: No  . Sexual activity: Yes    Partners: Female    Birth control/protection: None  Other Topics Concern  . Not on file  Social History Narrative   Works nights   Social Determinants of Health   Financial Resource Strain:   . Difficulty of Paying Living Expenses: Not on file  Food Insecurity:   . Worried About 07/2016 in the Last Year: Not on file  . Ran Out of Food in the Last Year: Not on file  Transportation Needs:   . Lack of Transportation (Medical): Not on file  . Lack of Transportation (Non-Medical): Not on file  Physical Activity:   . Days of Exercise per Week: Not on file  . Minutes of Exercise per Session: Not on file  Stress:   . Feeling of Stress : Not on file  Social Connections:   . Frequency of Communication with Friends and Family: Not on file  . Frequency of Social Gatherings with Friends and Family: Not on file  . Attends  Religious Services: Not on file  . Active Member of Clubs or Organizations: Not on file  . Attends Banker Meetings: Not on file  . Marital Status: Not on file  Intimate Partner Violence:   . Fear of Current or Ex-Partner: Not on file  . Emotionally Abused: Not on file  . Physically Abused: Not on file  . Sexually Abused: Not on file    Current Outpatient Medications on File Prior to Visit  Medication Sig Dispense Refill  . prenatal vitamin w/FE, FA (NATACHEW) 29-1 MG CHEW chewable tablet Chew 1 tablet by mouth daily at 12 noon.     No current facility-administered medications on file prior to visit.    No Known Allergies   Review of Systems General: Not Present- Fever, Weight Loss and Weight Gain. Skin: Not Present- Rash. HEENT: Not Present- Blurred Vision, Headache and Bleeding Gums. Respiratory: Not Present- Difficulty Breathing. Breast: Not Present- Breast  Mass. Cardiovascular: Not Present- Chest Pain, Elevated Blood Pressure, Fainting / Blacking Out and Shortness of Breath. Gastrointestinal: Not Present- Abdominal Pain, Constipation, and Vomiting.  Present - Nausea (mild).  Female Genitourinary: Not Present- Frequency, Painful Urination, Pelvic Pain, Vaginal Bleeding, Vaginal Discharge, Contractions, regular, Fetal Movements Decreased, Urinary Complaints and Vaginal Fluid. Musculoskeletal: Not Present- Back Pain and Leg Cramps. Neurological: Not Present- Dizziness. Psychiatric: Not Present- Depression.     Objective:   Blood pressure 114/69, pulse 99, weight 192 lb (87.1 kg), last menstrual period 12/01/2019.  Body mass index is 32.96 kg/m.  General Appearance:    Alert, cooperative, no distress, appears stated age, mild obesity  Head:    Normocephalic, without obvious abnormality, atraumatic  Eyes:    PERRL, conjunctiva/corneas clear, EOM's intact, both eyes  Ears:    Normal external ear canals, both ears  Nose:   Nares normal, septum midline, mucosa normal, no drainage or sinus tenderness  Throat:   Lips, mucosa, and tongue normal; teeth and gums normal  Neck:   Supple, symmetrical, trachea midline, no adenopathy; thyroid: no enlargement/tenderness/nodules; no carotid bruit or JVD  Back:     Symmetric, no curvature, ROM normal, no CVA tenderness  Lungs:     Clear to auscultation bilaterally, respirations unlabored  Chest Wall:    No tenderness or deformity   Heart:    Regular rate and rhythm, S1 and S2 normal, no murmur, rub or gallop  Breast Exam:    No tenderness, masses, or nipple abnormality  Abdomen:     Soft, non-tender, bowel sounds active all four quadrants, no masses, no organomegaly.  FHT 183  bpm.  Genitalia:    Pelvic:external genitalia normal, vagina without lesions, discharge, or tenderness, rectovaginal septum  normal. Cervix normal in appearance, no cervical motion tenderness, no adnexal masses or tenderness.  Pregnancy  positive findings: uterine enlargement: 11 wk size, nontender.   Rectal:    Normal external sphincter.  No hemorrhoids appreciated. Internal exam not done.   Extremities:   Extremities normal, atraumatic, no cyanosis or edema  Pulses:   2+ and symmetric all extremities  Skin:   Skin color, texture, turgor normal, no rashes or lesions  Lymph nodes:   Cervical, supraclavicular, and axillary nodes normal  Neurologic:   CNII-XII intact, normal strength, sensation and reflexes throughout      Assessment:   1. Encounter for supervision of other normal pregnancy in first trimester   2. [redacted] weeks gestation of pregnancy   3. History of migraine   4. Obesity (BMI 30.0-34.9)  5. History of pre-eclampsia   6. Hx of macrosomia in infant in prior pregnancy, currently pregnant     Plan:   1. Supervision of normal pregnancy  - Initial labs reviewed. - Prenatal vitamins encouraged. - Problem list reviewed and updated. - New OB counseling:  The patient has been given an overview regarding routine prenatal care.  - Prenatal testing, optional genetic testing, and ultrasound use in pregnancy were reviewed.  Cell free-DNA testing requested: ordered. - Benefits of Breast Feeding were discussed. The patient is encouraged to consider nursing her baby post partum.   2. History of migraines  - Notes 2-3 per month. Mild, usually lying down and resting resolves the headache. Will continue to monitor and treat as needed.   3. History of macrosomia  - Last pregnancy with > 9 lb infant.  Screened with A1c for insulin resistance in first trimester, was normal, 5.2.  Will rescreen at 26-[redacted] weeks gestation.   4. H/o pre-eclampsia in prior pregnancy - Discussed need to begin daily baby aspirin after [redacted] weeks gestation for prevention/delay onset of pre-eclampsia in subsequent pregnancies. Patient ok to start next week. Will monitor BPs in pregnancy.   5. Obesity in pregnancy - Recommendations regarding diet,  weight gain, and exercise in pregnancy were given.   Follow up in 4 weeks.  50% of 30 min visit spent on counseling and coordination of care.   Hildred Laser, MD [facility]

## 2020-02-18 NOTE — Progress Notes (Addendum)
NOB-PE pt present for routine prenatal care. [facility] completed today. Pt stated that she doing well no problems.

## 2020-02-18 NOTE — Patient Instructions (Addendum)
WHAT OB PATIENTS CAN EXPECT   Confirmation of pregnancy and ultrasound ordered if medically indicated-[redacted] weeks gestation  New OB (NOB) intake with nurse and New OB (NOB) labs- [redacted] weeks gestation  New OB (NOB) physical examination with provider- 11/[redacted] weeks gestation  Flu vaccine-[redacted] weeks gestation  Anatomy scan-[redacted] weeks gestation  Glucose tolerance test, blood work to test for anemia, T-dap vaccine-[redacted] weeks gestation  Vaginal swabs/cultures-STD/Group B strep-[redacted] weeks gestation  Appointments every 4 weeks until 28 weeks  Every 2 weeks from 28 weeks until 36 weeks  Weekly visits from 36 weeks until delivery  Second Trimester of Pregnancy  The second trimester is from week 14 through week 27 (month 4 through 6). This is often the time in pregnancy that you feel your best. Often times, morning sickness has lessened or quit. You may have more energy, and you may get hungry more often. Your unborn baby is growing rapidly. At the end of the sixth month, he or she is about 9 inches long and weighs about 1 pounds. You will likely feel the baby move between 18 and 20 weeks of pregnancy. Follow these instructions at home: Medicines  Take over-the-counter and prescription medicines only as told by your doctor. Some medicines are safe and some medicines are not safe during pregnancy.  Take a prenatal vitamin that contains at least 600 micrograms (mcg) of folic acid.  If you have trouble pooping (constipation), take medicine that will make your stool soft (stool softener) if your doctor approves. Eating and drinking   Eat regular, healthy meals.  Avoid raw meat and uncooked cheese.  If you get low calcium from the food you eat, talk to your doctor about taking a daily calcium supplement.  Avoid foods that are high in fat and sugars, such as fried and sweet foods.  If you feel sick to your stomach (nauseous) or throw up (vomit): ? Eat 4 or 5 small meals a day instead of 3 large  meals. ? Try eating a few soda crackers. ? Drink liquids between meals instead of during meals.  To prevent constipation: ? Eat foods that are high in fiber, like fresh fruits and vegetables, whole grains, and beans. ? Drink enough fluids to keep your pee (urine) clear or pale yellow. Activity  Exercise only as told by your doctor. Stop exercising if you start to have cramps.  Do not exercise if it is too hot, too humid, or if you are in a place of great height (high altitude).  Avoid heavy lifting.  Wear low-heeled shoes. Sit and stand up straight.  You can continue to have sex unless your doctor tells you not to. Relieving pain and discomfort  Wear a good support bra if your breasts are tender.  Take warm water baths (sitz baths) to soothe pain or discomfort caused by hemorrhoids. Use hemorrhoid cream if your doctor approves.  Rest with your legs raised if you have leg cramps or low back pain.  If you develop puffy, bulging veins (varicose veins) in your legs: ? Wear support hose or compression stockings as told by your doctor. ? Raise (elevate) your feet for 15 minutes, 3-4 times a day. ? Limit salt in your food. Prenatal care  Write down your questions. Take them to your prenatal visits.  Keep all your prenatal visits as told by your doctor. This is important. Safety  Wear your seat belt when driving.  Make a list of emergency phone numbers, including numbers for family, friends, the  hospital, and police and fire departments. General instructions  Ask your doctor about the right foods to eat or for help finding a counselor, if you need these services.  Ask your doctor about local prenatal classes. Begin classes before month 6 of your pregnancy.  Do not use hot tubs, steam rooms, or saunas.  Do not douche or use tampons or scented sanitary pads.  Do not cross your legs for long periods of time.  Visit your dentist if you have not done so. Use a soft toothbrush  to brush your teeth. Floss gently.  Avoid all smoking, herbs, and alcohol. Avoid drugs that are not approved by your doctor.  Do not use any products that contain nicotine or tobacco, such as cigarettes and e-cigarettes. If you need help quitting, ask your doctor.  Avoid cat litter boxes and soil used by cats. These carry germs that can cause birth defects in the baby and can cause a loss of your baby (miscarriage) or stillbirth. Contact a doctor if:  You have mild cramps or pressure in your lower belly.  You have pain when you pee (urinate).  You have bad smelling fluid coming from your vagina.  You continue to feel sick to your stomach (nauseous), throw up (vomit), or have watery poop (diarrhea).  You have a nagging pain in your belly area.  You feel dizzy. Get help right away if:  You have a fever.  You are leaking fluid from your vagina.  You have spotting or bleeding from your vagina.  You have severe belly cramping or pain.  You lose or gain weight rapidly.  You have trouble catching your breath and have chest pain.  You notice sudden or extreme puffiness (swelling) of your face, hands, ankles, feet, or legs.  You have not felt the baby move in over an hour.  You have severe headaches that do not go away when you take medicine.  You have trouble seeing. Summary  The second trimester is from week 14 through week 27 (months 4 through 6). This is often the time in pregnancy that you feel your best.  To take care of yourself and your unborn baby, you will need to eat healthy meals, take medicines only if your doctor tells you to do so, and do activities that are safe for you and your baby.  Call your doctor if you get sick or if you notice anything unusual about your pregnancy. Also, call your doctor if you need help with the right food to eat, or if you want to know what activities are safe for you. This information is not intended to replace advice given to you by  your health care provider. Make sure you discuss any questions you have with your health care provider. Document Revised: 07/19/2018 Document Reviewed: 05/02/2016 Elsevier Patient Education  2020 [employer]. How a Baby Grows During Pregnancy  Pregnancy begins when a female's sperm enters a female's egg (fertilization). Fertilization usually happens in one of the tubes (fallopian tubes) that connect the ovaries to the womb (uterus). The fertilized egg moves down the fallopian tube to the uterus. Once it reaches the uterus, it implants into the lining of the uterus and begins to grow. For the first 10 weeks, the fertilized egg is called an embryo. After 10 weeks, it is called a fetus. As the fetus continues to grow, it receives oxygen and nutrients through tissue (placenta) that grows to support the developing baby. The placenta is the life support system  for the baby. It provides oxygen and nutrition and removes waste. Learning as much as you can about your pregnancy and how your baby is developing can help you enjoy the experience. It can also make you aware of when there might be a problem and when to ask questions. How long does a typical pregnancy last? A pregnancy usually lasts 280 days, or about 40 weeks. Pregnancy is divided into three periods of growth, also called trimesters:  First trimester: 0-12 weeks.  Second trimester: 13-27 weeks.  Third trimester: 28-40 weeks. The day when your baby is ready to be born (full term) is your estimated date of delivery. How does my baby develop month by month? First month  The fertilized egg attaches to the inside of the uterus.  Some cells will form the placenta. Others will form the fetus.  The arms, legs, brain, spinal cord, lungs, and heart begin to develop.  At the end of the first month, the heart begins to beat. Second month  The bones, inner ear, eyelids, hands, and feet form.  The genitals develop.  By the end of 8 weeks, all  major organs are developing. Third month  All of the internal organs are forming.  Teeth develop below the gums.  Bones and muscles begin to grow. The spine can flex.  The skin is transparent.  Fingernails and toenails begin to form.  Arms and legs continue to grow longer, and hands and feet develop.  The fetus is about 3 inches (7.6 cm) long. Fourth month  The placenta is completely formed.  The external sex organs, neck, outer ear, eyebrows, eyelids, and fingernails are formed.  The fetus can hear, swallow, and move its arms and legs.  The kidneys begin to produce urine.  The skin is covered with a white, waxy coating (vernix) and very fine hair (lanugo). Fifth month  The fetus moves around more and can be felt for the first time (quickening).  The fetus starts to sleep and wake up and may begin to suck its finger.  The nails grow to the end of the fingers.  The organ in the digestive system that makes bile (gallbladder) functions and helps to digest nutrients.  If your baby is a girl, eggs are present in her ovaries. If your baby is a boy, testicles start to move down into his scrotum. Sixth month  The lungs are formed.  The eyes open. The brain continues to develop.  Your baby has fingerprints and toe prints. Your baby's hair grows thicker.  At the end of the second trimester, the fetus is about 9 inches (22.9 cm) long. Seventh month  The fetus kicks and stretches.  The eyes are developed enough to sense changes in light.  The hands can make a grasping motion.  The fetus responds to sound. Eighth month  All organs and body systems are fully developed and functioning.  Bones harden, and taste buds develop. The fetus may hiccup.  Certain areas of the brain are still developing. The skull remains soft. Ninth month  The fetus gains about  lb (0.23 kg) each week.  The lungs are fully developed.  Patterns of sleep develop.  The fetus's head  typically moves into a head-down position (vertex) in the uterus to prepare for birth.  The fetus weighs 6-9 lb (2.72-4.08 kg) and is 19-20 inches (48.26-50.8 cm) long. What can I do to have a healthy pregnancy and help my baby develop? General instructions  Take prenatal vitamins as  directed by your health care provider. These include vitamins such as folic acid, iron, calcium, and vitamin D. They are important for healthy development.  Take medicines only as directed by your health care provider. Read labels and ask a pharmacist or your health care provider whether over-the-counter medicines, supplements, and prescription drugs are safe to take during pregnancy.  Keep all follow-up visits as directed by your health care provider. This is important. Follow-up visits include prenatal care and screening tests. How do I know if my baby is developing well? At each prenatal visit, your health care provider will do several different tests to check on your health and keep track of your baby's development. These include:  Fundal height and position. ? Your health care provider will measure your growing belly from your pubic bone to the top of the uterus using a tape measure. ? Your health care provider will also feel your belly to determine your baby's position.  Heartbeat. ? An ultrasound in the first trimester can confirm pregnancy and show a heartbeat, depending on how far along you are. ? Your health care provider will check your baby's heart rate at every prenatal visit.  Second trimester ultrasound. ? This ultrasound checks your baby's development. It also may show your baby's gender. What should I do if I have concerns about my baby's development? Always talk with your health care provider about any concerns that you may have about your pregnancy and your baby. Summary  A pregnancy usually lasts 280 days, or about 40 weeks. Pregnancy is divided into three periods of growth, also called  trimesters.  Your health care provider will monitor your baby's growth and development throughout your pregnancy.  Follow your health care provider's recommendations about taking prenatal vitamins and medicines during your pregnancy.  Talk with your health care provider if you have any concerns about your pregnancy or your developing baby. This information is not intended to replace advice given to you by your health care provider. Make sure you discuss any questions you have with your health care provider. Document Revised: 07/18/2018 Document Reviewed: 02/07/2017 Elsevier Patient Education  2020 [employer].   Commonly Asked Questions During Pregnancy  Cats: A parasite can be excreted in cat feces.  To avoid exposure you need to have another person empty the little box.  If you must empty the litter box you will need to wear gloves.  Wash your hands after handling your cat.  This parasite can also be found in raw or undercooked meat so this should also be avoided.  Colds, Sore Throats, Flu: Please check your medication sheet to see what you can take for symptoms.  If your symptoms are unrelieved by these medications please call the office.  Dental Work: Most any dental work Investment banker, corporate recommends is permitted.  X-rays should only be taken during the first trimester if absolutely necessary.  Your abdomen should be shielded with a lead apron during all x-rays.  Please notify your provider prior to receiving any x-rays.  Novocaine is fine; gas is not recommended.  If your dentist requires a note from [place] prior to dental work please call the office and we will provide one for you.  Exercise: Exercise is an important part of staying healthy during your pregnancy.  You may continue most exercises you were accustomed to prior to pregnancy.  Later in your pregnancy you will most likely notice you have difficulty with activities requiring balance like riding a bicycle.  It is important  that you listen  to your body and avoid activities that put you at a higher risk of falling.  Adequate rest and staying well hydrated are a must!  If you have questions about the safety of specific activities ask your provider.    Exposure to Children with illness: Try to avoid obvious exposure; report any symptoms to [place] when noted,  If you have chicken pos, red measles or mumps, you should be immune to these diseases.   Please do not take any vaccines while pregnant unless you have checked with your OB provider.  Fetal Movement: After 28 weeks we recommend you do "kick counts" twice daily.  Lie or sit down in a calm quiet environment and count your baby movements "kicks".  You should feel your baby at least 10 times per hour.  If you have not felt 10 kicks within the first hour get up, walk around and have something sweet to eat or drink then repeat for an additional hour.  If count remains less than 10 per hour notify your provider.  Fumigating: Follow your pest control agent's advice as to how long to stay out of your home.  Ventilate the area well before re-entering.  Hemorrhoids:   Most over-the-counter preparations can be used during pregnancy.  Check your medication to see what is safe to use.  It is important to use a stool softener or fiber in your diet and to drink lots of liquids.  If hemorrhoids seem to be getting worse please call the office.   Hot Tubs:  Hot tubs Jacuzzis and saunas are not recommended while pregnant.  These increase your internal body temperature and should be avoided.  Intercourse:  Sexual intercourse is safe during pregnancy as long as you are comfortable, unless otherwise advised by your provider.  Spotting may occur after intercourse; report any bright red bleeding that is heavier than spotting.  Labor:  If you know that you are in labor, please go to the hospital.  If you are unsure, please call the office and let us help you decide what to do.  Lifting, straining, etc:  If your  job requires heavy lifting or straining please check with your provider for any limitations.  Generally, you should not lift items heavier than that you can lift simply with your hands and arms (no back muscles)  Painting:  Paint fumes do not harm your pregnancy, but may make you ill and should be avoided if possible.  Latex or water based paints have less odor than oils.  Use adequate ventilation while painting.  Permanents & Hair Color:  Chemicals in hair dyes are not recommended as they cause increase hair dryness which can increase hair loss during pregnancy.  " Highlighting" and permanents are allowed.  Dye may be absorbed differently and permanents may not hold as well during pregnancy.  Sunbathing:  Use a sunscreen, as skin burns easily during pregnancy.  Drink plenty of fluids; avoid over heating.  Tanning Beds:  Because their possible side effects are still unknown, tanning beds are not recommended.  Ultrasound Scans:  Routine ultrasounds are performed at approximately 20 weeks.  You will be able to see your baby's general anatomy an if you would like to know the gender this can usually be determined as well.  If it is questionable when you conceived you may also receive an ultrasound early in your pregnancy for dating purposes.  Otherwise ultrasound exams are not routinely performed unless there is a medical necessity.  Although you can request a scan we ask that you pay for it when conducted because insurance does not cover " patient request" scans.  Work: If your pregnancy proceeds without complications you may work until your due date, unless your physician or employer advises otherwise.  Round Ligament Pain/Pelvic Discomfort:  Sharp, shooting pains not associated with bleeding are fairly common, usually occurring in the second trimester of pregnancy.  They tend to be worse when standing up or when you remain standing for long periods of time.  These are the result of pressure of certain  pelvic ligaments called "round ligaments".  Rest, Tylenol and heat seem to be the most effective relief.  As the womb and fetus grow, they rise out of the pelvis and the discomfort improves.  Please notify the office if your pain seems different than that described.  It may represent a more serious condition.  Common Medications Safe in Pregnancy  Acne:      Constipation:  Benzoyl Peroxide     Colace  Clindamycin      Dulcolax Suppository  Topica Erythromycin     Fibercon  Salicylic Acid      Metamucil         Miralax AVOID:        Senakot   Accutane    Cough:  Retin-A       Cough Drops  Tetracycline      Phenergan w/ Codeine if Rx  Minocycline      Robitussin (Plain & DM)  Antibiotics:     Crabs/Lice:  Ceclor       RID  Cephalosporins    AVOID:  E-Mycins      Kwell  Keflex  Macrobid/Macrodantin   Diarrhea:  Penicillin      Kao-Pectate  Zithromax      Imodium AD         PUSH FLUIDS AVOID:       Cipro     Fever:  Tetracycline      Tylenol (Regular or Extra  Minocycline       Strength)  Levaquin      Extra Strength-Do not          Exceed 8 tabs/24 hrs Caffeine:        <29m/day (equiv. To 1 cup of coffee or  approx. 3 12 oz sodas)         Gas: Cold/Hayfever:       Gas-X  Benadryl      Mylicon  Claritin       Phazyme  **Claritin-D        Chlor-Trimeton    Headaches:  Dimetapp      ASA-Free Excedrin  Drixoral-Non-Drowsy     Cold Compress  Mucinex (Guaifenasin)     Tylenol (Regular or Extra  Sudafed/Sudafed-12 Hour     Strength)  **Sudafed PE Pseudoephedrine   Tylenol Cold & Sinus     Vicks Vapor Rub  Zyrtec  **AVOID if Problems With Blood Pressure         Heartburn: Avoid lying down for at least 1 hour after meals  Aciphex      Maalox     Rash:  Milk of Magnesia     Benadryl    Mylanta       1% Hydrocortisone Cream  Pepcid  Pepcid Complete   Sleep Aids:  Prevacid      Ambien   Prilosec       Benadryl  Rolaids  Chamomile Tea  Tums (Limit  4/day)     Unisom         Tylenol PM         Warm milk-add vanilla or  Hemorrhoids:       Sugar for taste  Anusol/Anusol H.C.  (RX: Analapram 2.5%)  Sugar Substitutes:  Hydrocortisone OTC     Ok in moderation  Preparation H      Tucks        Vaseline lotion applied to tissue with wiping    Herpes:     Throat:  Acyclovir      Oragel  Famvir  Valtrex     Vaccines:         Flu Shot Leg Cramps:       *Gardasil  Benadryl      Hepatitis A         Hepatitis B Nasal Spray:       Pneumovax  Saline Nasal Spray     Polio Booster         Tetanus Nausea:       Tuberculosis test or PPD  Vitamin B6 25 mg TID   AVOID:    Dramamine      *Gardasil  Emetrol       Live Poliovirus  Ginger Root 250 mg QID    MMR (measles, mumps &  High Complex Carbs @ Bedtime    rebella)  Sea Bands-Accupressure    Varicella (Chickenpox)  Unisom 1/2 tab TID     *No known complications           If received before Pain:         Known pregnancy;   Darvocet       Resume series after  Lortab        Delivery  Percocet    Yeast:   Tramadol      Femstat  Tylenol 3      Gyne-lotrimin  Ultram       Monistat  Vicodin           MISC:         All Sunscreens           Hair Coloring/highlights          Insect Repellant's          (Including DEET)         Mystic Tans

## 2020-02-19 LAB — CBC
Hematocrit: 35.4 % (ref 34.0–46.6)
Hemoglobin: 12.3 g/dL (ref 11.1–15.9)
MCH: 30.6 pg (ref 26.6–33.0)
MCHC: 34.7 g/dL (ref 31.5–35.7)
MCV: 88 fL (ref 79–97)
Platelets: 408 10*3/uL (ref 150–450)
RBC: 4.02 x10E6/uL (ref 3.77–5.28)
RDW: 12.1 % (ref 11.7–15.4)
WBC: 10 10*3/uL (ref 3.4–10.8)

## 2020-03-11 ENCOUNTER — Encounter: Payer: 59 | Admitting: Obstetrics and Gynecology

## 2020-03-17 ENCOUNTER — Other Ambulatory Visit: Payer: Self-pay

## 2020-03-17 ENCOUNTER — Ambulatory Visit (INDEPENDENT_AMBULATORY_CARE_PROVIDER_SITE_OTHER): Payer: BC Managed Care – PPO | Admitting: Obstetrics and Gynecology

## 2020-03-17 ENCOUNTER — Encounter: Payer: Self-pay | Admitting: Obstetrics and Gynecology

## 2020-03-17 VITALS — BP 118/71 | HR 73 | Wt 184.4 lb

## 2020-03-17 DIAGNOSIS — Z3A15 15 weeks gestation of pregnancy: Secondary | ICD-10-CM

## 2020-03-17 DIAGNOSIS — Z3482 Encounter for supervision of other normal pregnancy, second trimester: Secondary | ICD-10-CM

## 2020-03-17 LAB — POCT URINALYSIS DIPSTICK OB
Bilirubin, UA: NEGATIVE
Blood, UA: NEGATIVE
Glucose, UA: NEGATIVE
Ketones, UA: NEGATIVE
Leukocytes, UA: NEGATIVE
Nitrite, UA: NEGATIVE
Spec Grav, UA: 1.01 (ref 1.010–1.025)
Urobilinogen, UA: 0.2 E.U./dL
pH, UA: 7.5 (ref 5.0–8.0)

## 2020-03-17 NOTE — Progress Notes (Signed)
Patient present for routine prenatal visit. No complaints.

## 2020-03-17 NOTE — Progress Notes (Signed)
ROB: No complaints-patient feeling better now in the second trimester. Taking prenatal vitamins and aspirin as directed. Declines AFP today-we will consider it at next visit. FAS next visit.

## 2020-04-10 NOTE — L&D Delivery Note (Signed)
Delivery Summary for Jaime Mcintosh  Labor Events:   Preterm labor: No data found  Rupture date: No data found  Rupture time: No data found  Rupture type: Bulging bag of water  Fluid Color: No data found  Induction: No data found  Augmentation: No data found  Complications: No data found  Cervical ripening: No data found No data found   No data found     Delivery:   Episiotomy: No data found  Lacerations: No data found  Repair suture: No data found  Repair # of packets: No data found  Blood loss (ml): No data found   Information for the patient's newborn:  Janiene, Aarons [[id number]]    Delivery 09/04/2020 11:57 AM by  Vaginal, Spontaneous Sex:  female Gestational Age: [redacted]w[redacted]d Delivery Clinician:   Living?:         APGARS  One minute Five minutes Ten minutes  Skin color:        Heart rate:        Grimace:        Muscle tone:        Breathing:        Totals: 7  9      Presentation/position:      Resuscitation:   Cord information:    Disposition of cord blood:     Blood gases sent?  Complications:   Placenta: Delivered:       appearance Newborn Measurements: Weight: 7 lb 5.5 oz (3330 g)  Height: 19.69"  Head circumference:    Chest circumference:    Other providers:    Additional  information: Forceps:   Vacuum:   Breech:   Observed anomalies        Delivery Note At 11:57 AM a viable and healthy female "Jaime Mcintosh" was delivered via Vaginal, Spontaneous (Presentation: Vertex; Occiput Anterior).  APGAR: 7, 9; weight  3330 grams.   Placenta status: Spontaneous, Intact.  Cord: 3 vessels with the following complications: Nuchal cord x 1, reducible after delivery of fetal body.  Cord pH: not obtained.  Anesthesia: Epidural Episiotomy: None Lacerations: None Suture Repair: N/A Est. Blood Loss (mL):  150  Mom to postpartum.  Baby to Couplet care / Skin to Skin.  Hildred Laser, MD 09/04/2020, 12:16 PM

## 2020-04-12 NOTE — Patient Instructions (Addendum)
Breastfeeding  Choosing to breastfeed is one of the best decisions you can make for yourself and your baby. A change in hormones during pregnancy causes your breasts to make breast milk in your milk-producing glands. Hormones prevent breast milk from being released before your baby is born. They also prompt milk flow after birth. Once breastfeeding has begun, thoughts of your baby, as well as his or her sucking or crying, can stimulate the release of milk from your milk-producing glands. Benefits of breastfeeding Research shows that breastfeeding offers many health benefits for infants and mothers. It also offers a cost-free and convenient way to feed your baby. For your baby  Your first milk (colostrum) helps your baby's digestive system to function better.  Special cells in your milk (antibodies) help your baby to fight off infections.  Breastfed babies are less likely to develop asthma, allergies, obesity, or type 2 diabetes. They are also at lower risk for sudden infant death syndrome (SIDS).  Nutrients in breast milk are better able to meet your baby's needs compared to infant formula.  Breast milk improves your baby's brain development. For you  Breastfeeding helps to create a very special bond between you and your baby.  Breastfeeding is convenient. Breast milk costs nothing and is always available at the correct temperature.  Breastfeeding helps to burn calories. It helps you to lose the weight that you gained during pregnancy.  Breastfeeding makes your uterus return faster to its size before pregnancy. It also slows bleeding (lochia) after you give birth.  Breastfeeding helps to lower your risk of developing type 2 diabetes, osteoporosis, rheumatoid arthritis, cardiovascular disease, and breast, ovarian, uterine, and endometrial cancer later in life. Breastfeeding basics Starting breastfeeding  Find a comfortable place to sit or lie down, with your neck and back  well-supported.  Place a pillow or a rolled-up blanket under your baby to bring him or her to the level of your breast (if you are seated). Nursing pillows are specially designed to help support your arms and your baby while you breastfeed.  Make sure that your baby's tummy (abdomen) is facing your abdomen.  Gently massage your breast. With your fingertips, massage from the outer edges of your breast inward toward the nipple. This encourages milk flow. If your milk flows slowly, you may need to continue this action during the feeding.  Support your breast with 4 fingers underneath and your thumb above your nipple (make the letter "C" with your hand). Make sure your fingers are well away from your nipple and your baby's mouth.  Stroke your baby's lips gently with your finger or nipple.  When your baby's mouth is open wide enough, quickly bring your baby to your breast, placing your entire nipple and as much of the areola as possible into your baby's mouth. The areola is the colored area around your nipple. ? More areola should be visible above your baby's upper lip than below the lower lip. ? Your baby's lips should be opened and extended outward (flanged) to ensure an adequate, comfortable latch. ? Your baby's tongue should be between his or her lower gum and your breast.  Make sure that your baby's mouth is correctly positioned around your nipple (latched). Your baby's lips should create a seal on your breast and be turned out (everted).  It is common for your baby to suck about 2-3 minutes in order to start the flow of breast milk. Latching Teaching your baby how to latch onto your breast properly is   very important. An improper latch can cause nipple pain, decreased milk supply, and poor weight gain in your baby. Also, if your baby is not latched onto your nipple properly, he or she may swallow some air during feeding. This can make your baby fussy. Burping your baby when you switch breasts  during the feeding can help to get rid of the air. However, teaching your baby to latch on properly is still the best way to prevent fussiness from swallowing air while breastfeeding. Signs that your baby has successfully latched onto your nipple  Silent tugging or silent sucking, without causing you pain. Infant's lips should be extended outward (flanged).  Swallowing heard between every 3-4 sucks once your milk has started to flow (after your let-down milk reflex occurs).  Muscle movement above and in front of his or her ears while sucking. Signs that your baby has not successfully latched onto your nipple  Sucking sounds or smacking sounds from your baby while breastfeeding.  Nipple pain. If you think your baby has not latched on correctly, slip your finger into the corner of your baby's mouth to break the suction and place it between your baby's gums. Attempt to start breastfeeding again. Signs of successful breastfeeding Signs from your baby  Your baby will gradually decrease the number of sucks or will completely stop sucking.  Your baby will fall asleep.  Your baby's body will relax.  Your baby will retain a small amount of milk in his or her mouth.  Your baby will let go of your breast by himself or herself. Signs from you  Breasts that have increased in firmness, weight, and size 1-3 hours after feeding.  Breasts that are softer immediately after breastfeeding.  Increased milk volume, as well as a change in milk consistency and color by the fifth day of breastfeeding.  Nipples that are not sore, cracked, or bleeding. Signs that your baby is getting enough milk  Wetting at least 1-2 diapers during the first 24 hours after birth.  Wetting at least 5-6 diapers every 24 hours for the first week after birth. The urine should be clear or pale yellow by the age of 5 days.  Wetting 6-8 diapers every 24 hours as your baby continues to grow and develop.  At least 3 stools in  a 24-hour period by the age of 5 days. The stool should be soft and yellow.  At least 3 stools in a 24-hour period by the age of 7 days. The stool should be seedy and yellow.  No loss of weight greater than 10% of birth weight during the first 3 days of life.  Average weight gain of 4-7 oz (113-198 g) per week after the age of 4 days.  Consistent daily weight gain by the age of 5 days, without weight loss after the age of 2 weeks. After a feeding, your baby may spit up a small amount of milk. This is normal. Breastfeeding frequency and duration Frequent feeding will help you make more milk and can prevent sore nipples and extremely full breasts (breast engorgement). Breastfeed when you feel the need to reduce the fullness of your breasts or when your baby shows signs of hunger. This is called "breastfeeding on demand." Signs that your baby is hungry include:  Increased alertness, activity, or restlessness.  Movement of the head from side to side.  Opening of the mouth when the corner of the mouth or cheek is stroked (rooting).  Increased sucking sounds, smacking lips, cooing,   sighing, or squeaking.  Hand-to-mouth movements and sucking on fingers or hands.  Fussing or crying. Avoid introducing a pacifier to your baby in the first 4-6 weeks after your baby is born. After this time, you may choose to use a pacifier. Research has shown that pacifier use during the first year of a baby's life decreases the risk of sudden infant death syndrome (SIDS). Allow your baby to feed on each breast as long as he or she wants. When your baby unlatches or falls asleep while feeding from the first breast, offer the second breast. Because newborns are often sleepy in the first few weeks of life, you may need to awaken your baby to get him or her to feed. Breastfeeding times will vary from baby to baby. However, the following rules can serve as a guide to help you make sure that your baby is properly  fed:  Newborns (babies 4 weeks of age or younger) may breastfeed every 1-3 hours.  Newborns should not go without breastfeeding for longer than 3 hours during the day or 5 hours during the night.  You should breastfeed your baby a minimum of 8 times in a 24-hour period. Breast milk pumping     Pumping and storing breast milk allows you to make sure that your baby is exclusively fed your breast milk, even at times when you are unable to breastfeed. This is especially important if you go back to work while you are still breastfeeding, or if you are not able to be present during feedings. Your lactation consultant can help you find a method of pumping that works best for you and give you guidelines about how long it is safe to store breast milk. Caring for your breasts while you breastfeed Nipples can become dry, cracked, and sore while breastfeeding. The following recommendations can help keep your breasts moisturized and healthy:  Avoid using soap on your nipples.  Wear a supportive bra designed especially for nursing. Avoid wearing underwire-style bras or extremely tight bras (sports bras).  Air-dry your nipples for 3-4 minutes after each feeding.  Use only cotton bra pads to absorb leaked breast milk. Leaking of breast milk between feedings is normal.  Use lanolin on your nipples after breastfeeding. Lanolin helps to maintain your skin's normal moisture barrier. Pure lanolin is not harmful (not toxic) to your baby. You may also hand express a few drops of breast milk and gently massage that milk into your nipples and allow the milk to air-dry. In the first few weeks after giving birth, some women experience breast engorgement. Engorgement can make your breasts feel heavy, warm, and tender to the touch. Engorgement peaks within 3-5 days after you give birth. The following recommendations can help to ease engorgement:  Completely empty your breasts while breastfeeding or pumping. You may  want to start by applying warm, moist heat (in the shower or with warm, water-soaked hand towels) just before feeding or pumping. This increases circulation and helps the milk flow. If your baby does not completely empty your breasts while breastfeeding, pump any extra milk after he or she is finished.  Apply ice packs to your breasts immediately after breastfeeding or pumping, unless this is too uncomfortable for you. To do this: ? Put ice in a plastic bag. ? Place a towel between your skin and the bag. ? Leave the ice on for 20 minutes, 2-3 times a day.  Make sure that your baby is latched on and positioned properly while breastfeeding. If   engorgement persists after 48 hours of following these recommendations, contact your health care provider or a Science writer. Overall health care recommendations while breastfeeding  Eat 3 healthy meals and 3 snacks every day. Well-nourished mothers who are breastfeeding need an additional 450-500 calories a day. You can meet this requirement by increasing the amount of a balanced diet that you eat.  Drink enough water to keep your urine pale yellow or clear.  Rest often, relax, and continue to take your prenatal vitamins to prevent fatigue, stress, and low vitamin and mineral levels in your body (nutrient deficiencies).  Do not use any products that contain nicotine or tobacco, such as cigarettes and e-cigarettes. Your baby may be harmed by chemicals from cigarettes that pass into breast milk and exposure to secondhand smoke. If you need help quitting, ask your health care provider.  Avoid alcohol.  Do not use illegal drugs or marijuana.  Talk with your health care provider before taking any medicines. These include over-the-counter and prescription medicines as well as vitamins and herbal supplements. Some medicines that may be harmful to your baby can pass through breast milk.  It is possible to become pregnant while breastfeeding. If birth  control is desired, ask your health care provider about options that will be safe while breastfeeding your baby. Where to find more information: [employer]: www.llli.org Contact a health care provider if:  You feel like you want to stop breastfeeding or have become frustrated with breastfeeding.  Your nipples are cracked or bleeding.  Your breasts are red, tender, or warm.  You have: ? Painful breasts or nipples. ? A swollen area on either breast. ? A fever or chills. ? Nausea or vomiting. ? Drainage other than breast milk from your nipples.  Your breasts do not become full before feedings by the fifth day after you give birth.  You feel sad and depressed.  Your baby is: ? Too sleepy to eat well. ? Having trouble sleeping. ? More than 21 week old and wetting fewer than 6 diapers in a 24-hour period. ? Not gaining weight by 63 days of age.  Your baby has fewer than 3 stools in a 24-hour period.  Your baby's skin or the white parts of his or her eyes become yellow. Get help right away if:  Your baby is overly tired (lethargic) and does not want to wake up and feed.  Your baby develops an unexplained fever. Summary  Breastfeeding offers many health benefits for infant and mothers.  Try to breastfeed your infant when he or she shows early signs of hunger.  Gently tickle or stroke your baby's lips with your finger or nipple to allow the baby to open his or her mouth. Bring the baby to your breast. Make sure that much of the areola is in your baby's mouth. Offer one side and burp the baby before you offer the other side.  Talk with your health care provider or lactation consultant if you have questions or you face problems as you breastfeed. This information is not intended to replace advice given to you by your health care provider. Make sure you discuss any questions you have with your health care provider. Document Revised: 06/21/2017 Document Reviewed:  04/28/2016 Elsevier Patient Education  [place] of Pregnancy  The second trimester is from week 14 through week 27 (month 4 through 6). This is often the time in pregnancy that you feel your best. Often times, morning  sickness has lessened or quit. You may have more energy, and you may get hungry more often. Your unborn baby is growing rapidly. At the end of the sixth month, he or she is about 9 inches long and weighs about 1 pounds. You will likely feel the baby move between 18 and 20 weeks of pregnancy. Follow these instructions at home: Medicines  Take over-the-counter and prescription medicines only as told by your doctor. Some medicines are safe and some medicines are not safe during pregnancy.  Take a prenatal vitamin that contains at least 600 micrograms (mcg) of folic acid.  If you have trouble pooping (constipation), take medicine that will make your stool soft (stool softener) if your doctor approves. Eating and drinking   Eat regular, healthy meals.  Avoid raw meat and uncooked cheese.  If you get low calcium from the food you eat, talk to your doctor about taking a daily calcium supplement.  Avoid foods that are high in fat and sugars, such as fried and sweet foods.  If you feel sick to your stomach (nauseous) or throw up (vomit): ? Eat 4 or 5 small meals a day instead of 3 large meals. ? Try eating a few soda crackers. ? Drink liquids between meals instead of during meals.  To prevent constipation: ? Eat foods that are high in fiber, like fresh fruits and vegetables, whole grains, and beans. ? Drink enough fluids to keep your pee (urine) clear or pale yellow. Activity  Exercise only as told by your doctor. Stop exercising if you start to have cramps.  Do not exercise if it is too hot, too humid, or if you are in a place of great height (high altitude).  Avoid heavy lifting.  Wear low-heeled shoes. Sit and stand up straight.  You  can continue to have sex unless your doctor tells you not to. Relieving pain and discomfort  Wear a good support bra if your breasts are tender.  Take warm water baths (sitz baths) to soothe pain or discomfort caused by hemorrhoids. Use hemorrhoid cream if your doctor approves.  Rest with your legs raised if you have leg cramps or low back pain.  If you develop puffy, bulging veins (varicose veins) in your legs: ? Wear support hose or compression stockings as told by your doctor. ? Raise (elevate) your feet for 15 minutes, 3-4 times a day. ? Limit salt in your food. Prenatal care  Write down your questions. Take them to your prenatal visits.  Keep all your prenatal visits as told by your doctor. This is important. Safety  Wear your seat belt when driving.  Make a list of emergency phone numbers, including numbers for family, friends, the hospital, and police and fire departments. General instructions  Ask your doctor about the right foods to eat or for help finding a counselor, if you need these services.  Ask your doctor about local prenatal classes. Begin classes before month 6 of your pregnancy.  Do not use hot tubs, steam rooms, or saunas.  Do not douche or use tampons or scented sanitary pads.  Do not cross your legs for long periods of time.  Visit your dentist if you have not done so. Use a soft toothbrush to brush your teeth. Floss gently.  Avoid all smoking, herbs, and alcohol. Avoid drugs that are not approved by your doctor.  Do not use any products that contain nicotine or tobacco, such as cigarettes and e-cigarettes. If you need help quitting, ask your  doctor.  Avoid cat litter boxes and soil used by cats. These carry germs that can cause birth defects in the baby and can cause a loss of your baby (miscarriage) or stillbirth. Contact a doctor if:  You have mild cramps or pressure in your lower belly.  You have pain when you pee (urinate).  You have bad  smelling fluid coming from your vagina.  You continue to feel sick to your stomach (nauseous), throw up (vomit), or have watery poop (diarrhea).  You have a nagging pain in your belly area.  You feel dizzy. Get help right away if:  You have a fever.  You are leaking fluid from your vagina.  You have spotting or bleeding from your vagina.  You have severe belly cramping or pain.  You lose or gain weight rapidly.  You have trouble catching your breath and have chest pain.  You notice sudden or extreme puffiness (swelling) of your face, hands, ankles, feet, or legs.  You have not felt the baby move in over an hour.  You have severe headaches that do not go away when you take medicine.  You have trouble seeing. Summary  The second trimester is from week 14 through week 27 (months 4 through 6). This is often the time in pregnancy that you feel your best.  To take care of yourself and your unborn baby, you will need to eat healthy meals, take medicines only if your doctor tells you to do so, and do activities that are safe for you and your baby.  Call your doctor if you get sick or if you notice anything unusual about your pregnancy. Also, call your doctor if you need help with the right food to eat, or if you want to know what activities are safe for you. This information is not intended to replace advice given to you by your health care provider. Make sure you discuss any questions you have with your health care provider. Document Revised: 07/19/2018 Document Reviewed: 05/02/2016 Elsevier Patient Education  [place]. Common Medications Safe in Pregnancy  Acne:      Constipation:  Benzoyl Peroxide     Colace  Clindamycin      Dulcolax Suppository  Topica Erythromycin     Fibercon  Salicylic Acid      Metamucil         Miralax AVOID:        Senakot   Accutane    Cough:  Retin-A       Cough Drops  Tetracycline      Phenergan w/ Codeine if  Rx  Minocycline      Robitussin (Plain & DM)  Antibiotics:     Crabs/Lice:  Ceclor       RID  Cephalosporins    AVOID:  E-Mycins      Kwell  Keflex  Macrobid/Macrodantin   Diarrhea:  Penicillin      Kao-Pectate  Zithromax      Imodium AD         PUSH FLUIDS AVOID:       Cipro     Fever:  Tetracycline      Tylenol (Regular or Extra  Minocycline       Strength)  Levaquin      Extra Strength-Do not          Exceed 8 tabs/24 hrs Caffeine:        '200mg'$ /day (equiv. To 1 cup of coffee or  approx. 3 12 oz sodas)  Gas: Cold/Hayfever:       Gas-X  Benadryl      Mylicon  Claritin       Phazyme  **Claritin-D        Chlor-Trimeton    Headaches:  Dimetapp      ASA-Free Excedrin  Drixoral-Non-Drowsy     Cold Compress  Mucinex (Guaifenasin)     Tylenol (Regular or Extra  Sudafed/Sudafed-12 Hour     Strength)  **Sudafed PE Pseudoephedrine   Tylenol Cold & Sinus     Vicks Vapor Rub  Zyrtec  **AVOID if Problems With Blood Pressure         Heartburn: Avoid lying down for at least 1 hour after meals  Aciphex      Maalox     Rash:  Milk of Magnesia     Benadryl    Mylanta       1% Hydrocortisone Cream  Pepcid  Pepcid Complete   Sleep Aids:  Prevacid      Ambien   Prilosec       Benadryl  Rolaids       Chamomile Tea  Tums (Limit 4/day)     Unisom         Tylenol PM         Warm milk-add vanilla or  Hemorrhoids:       Sugar for taste  Anusol/Anusol H.C.  (RX: Analapram 2.5%)  Sugar Substitutes:  Hydrocortisone OTC     Ok in moderation  Preparation H      Tucks        Vaseline lotion applied to tissue with wiping    Herpes:     Throat:  Acyclovir      Oragel  Famvir  Valtrex     Vaccines:         Flu Shot Leg Cramps:       *Gardasil  Benadryl      Hepatitis A         Hepatitis B Nasal Spray:       Pneumovax  Saline Nasal Spray     Polio Booster         Tetanus Nausea:       Tuberculosis test or PPD  Vitamin B6 25 mg  TID   AVOID:    Dramamine      *Gardasil  Emetrol       Live Poliovirus  Ginger Root 250 mg QID    MMR (measles, mumps &  High Complex Carbs @ Bedtime    rebella)  Sea Bands-Accupressure    Varicella (Chickenpox)  Unisom 1/2 tab TID     *No known complications           If received before Pain:         Known pregnancy;   Darvocet       Resume series after  Lortab        Delivery  Percocet    Yeast:   Tramadol      Femstat  Tylenol 3      Gyne-lotrimin  Ultram       Monistat  Vicodin           MISC:         All Sunscreens           Hair Coloring/highlights          Insect Repellant's          (Including DEET)         Mystic Tans

## 2020-04-14 ENCOUNTER — Other Ambulatory Visit (INDEPENDENT_AMBULATORY_CARE_PROVIDER_SITE_OTHER): Payer: BC Managed Care – PPO

## 2020-04-14 ENCOUNTER — Other Ambulatory Visit: Payer: Self-pay | Admitting: Obstetrics and Gynecology

## 2020-04-14 ENCOUNTER — Ambulatory Visit (INDEPENDENT_AMBULATORY_CARE_PROVIDER_SITE_OTHER): Payer: BC Managed Care – PPO | Admitting: Obstetrics and Gynecology

## 2020-04-14 ENCOUNTER — Other Ambulatory Visit: Payer: Self-pay

## 2020-04-14 ENCOUNTER — Encounter: Payer: Self-pay | Admitting: Obstetrics and Gynecology

## 2020-04-14 VITALS — BP 101/67 | HR 80 | Wt 187.3 lb

## 2020-04-14 DIAGNOSIS — Z3492 Encounter for supervision of normal pregnancy, unspecified, second trimester: Secondary | ICD-10-CM

## 2020-04-14 DIAGNOSIS — Z8759 Personal history of other complications of pregnancy, childbirth and the puerperium: Secondary | ICD-10-CM

## 2020-04-14 DIAGNOSIS — Z3482 Encounter for supervision of other normal pregnancy, second trimester: Secondary | ICD-10-CM

## 2020-04-14 DIAGNOSIS — Z3A19 19 weeks gestation of pregnancy: Secondary | ICD-10-CM | POA: Diagnosis not present

## 2020-04-14 DIAGNOSIS — O09299 Supervision of pregnancy with other poor reproductive or obstetric history, unspecified trimester: Secondary | ICD-10-CM

## 2020-04-14 DIAGNOSIS — Z141 Cystic fibrosis carrier: Secondary | ICD-10-CM | POA: Insufficient documentation

## 2020-04-14 LAB — POCT URINALYSIS DIPSTICK OB
Bilirubin, UA: NEGATIVE
Blood, UA: NEGATIVE
Glucose, UA: NEGATIVE
Ketones, UA: NEGATIVE
Leukocytes, UA: NEGATIVE
Nitrite, UA: NEGATIVE
POC,PROTEIN,UA: NEGATIVE
Spec Grav, UA: 1.01 (ref 1.010–1.025)
Urobilinogen, UA: 0.2 E.U./dL
pH, UA: 6 (ref 5.0–8.0)

## 2020-04-14 NOTE — Progress Notes (Signed)
ROB-Pt present for routine prenatal care and anatomy scan. Pt stated that she was doing well no problems.

## 2020-04-14 NOTE — Progress Notes (Signed)
ROB: Doing well, no issues. Anatomy scan performed today, incomplete for heart views and profile. To return in 2-3 weeks for follow up.  Answered questions about risk of pre-eclampsia in this pregnancy. Is taking aspirin. Also discussed CF carrier screening (positive). Recommend partner testing. Discussed breastfeeding. RTC in 4 weeks.     The following were addressed during this visit:  Breastfeeding Education - The importance of exclusive breastfeeding    Comments: Provides antibodies, Lower risk of breast and ovarian cancers, and type-2 diabetes,Helps your body recover, Reduced chance of SIDS.   - Frequent feeding to help assure optimal milk production    Comments: Making a full supply of milk requires frequent removal of milk from breasts, infant will eat 8-12 times in 24 hours, if separated from infant use breast massage, hand expression and/ or pumping to remove milk from breasts.   - Exclusive breastfeeding for the first 6 months    Comments: Builds a healthy milk supply and keeps it up, protects baby from sickness and disease, and breastmilk has everything your baby needs for the first 6 months.

## 2020-04-28 ENCOUNTER — Other Ambulatory Visit: Payer: BC Managed Care – PPO

## 2020-05-04 ENCOUNTER — Other Ambulatory Visit: Payer: BC Managed Care – PPO

## 2020-05-05 ENCOUNTER — Other Ambulatory Visit: Payer: Self-pay

## 2020-05-05 ENCOUNTER — Ambulatory Visit (INDEPENDENT_AMBULATORY_CARE_PROVIDER_SITE_OTHER): Payer: BC Managed Care – PPO

## 2020-05-05 ENCOUNTER — Other Ambulatory Visit: Payer: Self-pay | Admitting: Obstetrics and Gynecology

## 2020-05-05 DIAGNOSIS — Z362 Encounter for other antenatal screening follow-up: Secondary | ICD-10-CM | POA: Diagnosis not present

## 2020-05-05 DIAGNOSIS — Z3A22 22 weeks gestation of pregnancy: Secondary | ICD-10-CM | POA: Diagnosis not present

## 2020-05-14 ENCOUNTER — Ambulatory Visit (INDEPENDENT_AMBULATORY_CARE_PROVIDER_SITE_OTHER): Payer: BC Managed Care – PPO | Admitting: Obstetrics and Gynecology

## 2020-05-14 ENCOUNTER — Other Ambulatory Visit: Payer: Self-pay

## 2020-05-14 ENCOUNTER — Encounter: Payer: Self-pay | Admitting: Obstetrics and Gynecology

## 2020-05-14 VITALS — BP 97/64 | HR 87 | Wt 194.1 lb

## 2020-05-14 DIAGNOSIS — Z3A23 23 weeks gestation of pregnancy: Secondary | ICD-10-CM

## 2020-05-14 DIAGNOSIS — Z3482 Encounter for supervision of other normal pregnancy, second trimester: Secondary | ICD-10-CM

## 2020-05-14 LAB — POCT URINALYSIS DIPSTICK OB
Appearance: NORMAL
Bilirubin, UA: NEGATIVE
Blood, UA: NEGATIVE
Glucose, UA: NEGATIVE
Ketones, UA: NEGATIVE
Leukocytes, UA: NEGATIVE
Nitrite, UA: NEGATIVE
Odor: NORMAL
POC,PROTEIN,UA: NEGATIVE
Spec Grav, UA: 1.015 (ref 1.010–1.025)
Urobilinogen, UA: 0.2 E.U./dL
pH, UA: 7 (ref 5.0–8.0)

## 2020-05-14 NOTE — Progress Notes (Signed)
ROB: No complaints today.  Fetal anatomy ultrasound is complete.  Taking vitamins and ASA.  1 hour GCT next visit.

## 2020-05-14 NOTE — Progress Notes (Signed)
Patient reports no concerns today 

## 2020-06-09 ENCOUNTER — Other Ambulatory Visit: Payer: Self-pay

## 2020-06-09 DIAGNOSIS — Z3483 Encounter for supervision of other normal pregnancy, third trimester: Secondary | ICD-10-CM

## 2020-06-10 ENCOUNTER — Encounter: Payer: Self-pay | Admitting: Obstetrics and Gynecology

## 2020-06-10 ENCOUNTER — Other Ambulatory Visit: Payer: BC Managed Care – PPO

## 2020-06-10 ENCOUNTER — Other Ambulatory Visit: Payer: Self-pay

## 2020-06-10 ENCOUNTER — Ambulatory Visit (INDEPENDENT_AMBULATORY_CARE_PROVIDER_SITE_OTHER): Payer: BC Managed Care – PPO | Admitting: Obstetrics and Gynecology

## 2020-06-10 VITALS — BP 101/70 | HR 108 | Ht 64.0 in | Wt 196.8 lb

## 2020-06-10 DIAGNOSIS — Z2913 Encounter for prophylactic Rho(D) immune globulin: Secondary | ICD-10-CM | POA: Diagnosis not present

## 2020-06-10 DIAGNOSIS — Z3483 Encounter for supervision of other normal pregnancy, third trimester: Secondary | ICD-10-CM | POA: Diagnosis not present

## 2020-06-10 DIAGNOSIS — Z23 Encounter for immunization: Secondary | ICD-10-CM

## 2020-06-10 DIAGNOSIS — Z3A28 28 weeks gestation of pregnancy: Secondary | ICD-10-CM

## 2020-06-10 LAB — POCT URINALYSIS DIPSTICK OB
Bilirubin, UA: NEGATIVE
Blood, UA: NEGATIVE
Glucose, UA: NEGATIVE
Ketones, UA: NEGATIVE
Leukocytes, UA: NEGATIVE
Nitrite, UA: NEGATIVE
POC,PROTEIN,UA: NEGATIVE
Spec Grav, UA: 1.025 (ref 1.010–1.025)
Urobilinogen, UA: 0.2 E.U./dL
pH, UA: 7 (ref 5.0–8.0)

## 2020-06-10 MED ORDER — RHO D IMMUNE GLOBULIN 1500 UNIT/2ML IJ SOSY
300.0000 ug | PREFILLED_SYRINGE | Freq: Once | INTRAMUSCULAR | Status: AC
  Administered 2020-06-10: 300 ug via INTRAMUSCULAR

## 2020-06-10 MED ORDER — TETANUS-DIPHTH-ACELL PERTUSSIS 5-2.5-18.5 LF-MCG/0.5 IM SUSY
0.5000 mL | PREFILLED_SYRINGE | Freq: Once | INTRAMUSCULAR | Status: AC
  Administered 2020-06-10: 0.5 mL via INTRAMUSCULAR

## 2020-06-10 NOTE — Progress Notes (Signed)
OB-Pt present for 28 weeks labs and routine prenatal care.  BTC and tdap completed. Pt would like to get flu vaccine at next visit. Pt voiced that she was doing well.

## 2020-06-10 NOTE — Patient Instructions (Signed)
Third Trimester of Pregnancy  The third trimester of pregnancy is from week 28 through week 40. This is also called months 7 through 9. This trimester is when your unborn baby (fetus) is growing very fast. At the end of the ninth month, the unborn baby is about 20 inches long. It weighs about 6-10 pounds. Body changes during your third trimester Your body continues to go through many changes during this time. The changes vary and generally return to normal after the baby is born. Physical changes  Your weight will continue to increase. You may gain 25-35 pounds (11-16 kg) by the end of the pregnancy. If you are underweight, you may gain 28-40 lb (about 13-18 kg). If you are overweight, you may gain 15-25 lb (about 7-11 kg).  You may start to get stretch marks on your hips, belly (abdomen), and breasts.  Your breasts will continue to grow and may hurt. A yellow fluid (colostrum) may leak from your breasts. This is the first milk you are making for your baby.  You may have changes in your hair.  Your belly button may stick out.  You may have more swelling in your hands, face, or ankles. Health changes  You may have heartburn.  You may have trouble pooping (constipation).  You may get hemorrhoids. These are swollen veins in the butt that can itch or get painful.  You may have swollen veins (varicose veins) in your legs.  You may have more body aches in the pelvis, back, or thighs.  You may have more tingling or numbness in your hands, arms, and legs. The skin on your belly may also feel numb.  You may feel short of breath as your womb (uterus) gets bigger. Other changes  You may pee (urinate) more often.  You may have more problems sleeping.  You may notice the unborn baby "dropping," or moving lower in your belly.  You may have more discharge coming from your vagina.  Your joints may feel loose, and you may have pain around your pelvic bone. Follow these instructions at  home: Medicines  Take over-the-counter and prescription medicines only as told by your doctor. Some medicines are not safe during pregnancy.  Take a prenatal vitamin that contains at least 600 micrograms (mcg) of folic acid. Eating and drinking  Eat healthy meals that include: ? Fresh fruits and vegetables. ? Whole grains. ? Good sources of protein, such as meat, eggs, or tofu. ? Low-fat dairy products.  Avoid raw meat and unpasteurized juice, milk, and cheese. These carry germs that can harm you and your baby.  Eat 4 or 5 small meals rather than 3 large meals a day.  You may need to take these actions to prevent or treat trouble pooping: ? Drink enough fluids to keep your pee (urine) pale yellow. ? Eat foods that are high in fiber. These include beans, whole grains, and fresh fruits and vegetables. ? Limit foods that are high in fat and sugar. These include fried or sweet foods. Activity  Exercise only as told by your doctor. Stop exercising if you start to have cramps in your womb.  Avoid heavy lifting.  Do not exercise if it is too hot or too humid, or if you are in a place of great height (high altitude).  If you choose to, you may have sex unless your doctor tells you not to. Relieving pain and discomfort  Take breaks often, and rest with your legs raised (elevated) if you have   leg cramps or low back pain.  Take warm water baths (sitz baths) to soothe pain or discomfort caused by hemorrhoids. Use hemorrhoid cream if your doctor approves.  Wear a good support bra if your breasts are tender.  If you develop bulging, swollen veins in your legs: ? Wear support hose as told by your doctor. ? Raise your feet for 15 minutes, 3-4 times a day. ? Limit salt in your food. Safety  Talk to your doctor before traveling far distances.  Do not use hot tubs, steam rooms, or saunas.  Wear your seat belt at all times when you are in a car.  Talk with your doctor if someone is  hurting you or yelling at you a lot. Preparing for your baby's arrival To prepare for the arrival of your baby:  Take prenatal classes.  Visit the hospital and tour the maternity area.  Buy a rear-facing car seat. Learn how to install it in your car.  Prepare the baby's room. Take out all pillows and stuffed animals from the baby's crib. General instructions  Avoid cat litter boxes and soil used by cats. These carry germs that can cause harm to the baby and can cause a loss of your baby by miscarriage or stillbirth.  Do not douche or use tampons. Do not use scented sanitary pads.  Do not smoke or use any products that contain nicotine or tobacco. If you need help quitting, ask your doctor.  Do not drink alcohol.  Do not use herbal medicines, illegal drugs, or medicines that were not approved by your doctor. Chemicals in these products can affect your baby.  Keep all follow-up visits. This is important. Where to find more information  American Pregnancy Association: americanpregnancy.org  [employer]: www.acog.org  Office on Women's Health: KeywordPortfolios.com.br Contact a doctor if:  You have a fever.  You have mild cramps or pressure in your lower belly.  You have a nagging pain in your belly area.  You vomit, or you have watery poop (diarrhea).  You have bad-smelling fluid coming from your vagina.  You have pain when you pee, or your pee smells bad.  You have a headache that does not go away when you take medicine.  You have changes in how you see, or you see spots in front of your eyes. Get help right away if:  Your water breaks.  You have regular contractions that are less than 5 minutes apart.  You are spotting or bleeding from your vagina.  You have very bad belly cramps or pain.  You have trouble breathing.  You have chest pain.  You faint.  You have not felt the baby move for the amount of time told by  your doctor.  You have new or increased pain, swelling, or redness in an arm or leg. Summary  The third trimester is from week 28 through week 40 (months 7 through 9). This is the time when your unborn baby is growing very fast.  During this time, your discomfort may increase as you gain weight and as your baby grows.  Get ready for your baby to arrive by taking prenatal classes, buying a rear-facing car seat, and preparing the baby's room.  Get help right away if you are bleeding from your vagina, you have chest pain and trouble breathing, or you have not felt the baby move for the amount of time told by your doctor. This information is not intended to replace advice given  to you by your health care provider. Make sure you discuss any questions you have with your health care provider. Document Revised: 09/03/2019 Document Reviewed: 07/10/2019 Elsevier Patient Education  [place]. Common Medications Safe in Pregnancy  Acne:      Constipation:  Benzoyl Peroxide     Colace  Clindamycin      Dulcolax Suppository  Topica Erythromycin     Fibercon  Salicylic Acid      Metamucil         Miralax AVOID:        Senakot   Accutane    Cough:  Retin-A       Cough Drops  Tetracycline      Phenergan w/ Codeine if Rx  Minocycline      Robitussin (Plain & DM)  Antibiotics:     Crabs/Lice:  Ceclor       RID  Cephalosporins    AVOID:  E-Mycins      Kwell  Keflex  Macrobid/Macrodantin   Diarrhea:  Penicillin      Kao-Pectate  Zithromax      Imodium AD         PUSH FLUIDS AVOID:       Cipro     Fever:  Tetracycline      Tylenol (Regular or Extra  Minocycline       Strength)  Levaquin      Extra Strength-Do not          Exceed 8 tabs/24 hrs Caffeine:        <216m/day (equiv. To 1 cup of coffee or  approx. 3 12 oz  sodas)         Gas: Cold/Hayfever:       Gas-X  Benadryl      Mylicon  Claritin       Phazyme  **Claritin-D        Chlor-Trimeton    Headaches:  Dimetapp      ASA-Free Excedrin  Drixoral-Non-Drowsy     Cold Compress  Mucinex (Guaifenasin)     Tylenol (Regular or Extra  Sudafed/Sudafed-12 Hour     Strength)  **Sudafed PE Pseudoephedrine   Tylenol Cold & Sinus     Vicks Vapor Rub  Zyrtec  **AVOID if Problems With Blood Pressure         Heartburn: Avoid lying down for at least 1 hour after meals  Aciphex      Maalox     Rash:  Milk of Magnesia     Benadryl    Mylanta       1% Hydrocortisone Cream  Pepcid  Pepcid Complete   Sleep Aids:  Prevacid      Ambien   Prilosec       Benadryl  Rolaids       Chamomile Tea  Tums (Limit 4/day)     Unisom         Tylenol PM         Warm milk-add vanilla or  Hemorrhoids:       Sugar for taste  Anusol/Anusol H.C.  (RX: Analapram 2.5%)  Sugar Substitutes:  Hydrocortisone OTC     Ok in moderation  Preparation H      Tucks        Vaseline lotion applied to tissue with wiping    Herpes:     Throat:  Acyclovir      Oragel  Famvir  Valtrex     Vaccines:  Flu Shot Leg Cramps:       *Gardasil  Benadryl      Hepatitis A         Hepatitis B Nasal Spray:       Pneumovax  Saline Nasal Spray     Polio Booster         Tetanus Nausea:       Tuberculosis test or PPD  Vitamin B6 25 mg TID   AVOID:    Dramamine      *Gardasil  Emetrol       Live Poliovirus  Ginger Root 250 mg QID    MMR (measles, mumps &  High Complex Carbs @ Bedtime    rebella)  Sea Bands-Accupressure    Varicella (Chickenpox)  Unisom 1/2 tab TID     *No known complications           If received before Pain:         Known pregnancy;   Darvocet       Resume series after  Lortab        Delivery  Percocet    Yeast:   Tramadol      Femstat  Tylenol 3      Gyne-lotrimin  Ultram       Monistat  Vicodin           MISC:         All Sunscreens           Hair  Coloring/highlights          Insect Repellant's          (Including DEET)         Mystic Tans Breastfeeding  Choosing to breastfeed is one of the best decisions you can make for yourself and your baby. A change in hormones during pregnancy causes your breasts to make breast milk in your milk-producing glands. Hormones prevent breast milk from being released before your baby is born. They also prompt milk flow after birth. Once breastfeeding has begun, thoughts of your baby, as well as his or her sucking or crying, can stimulate the release of milk from your milk-producing glands. Benefits of breastfeeding Research shows that breastfeeding offers many health benefits for infants and mothers. It also offers a cost-free and convenient way to feed your baby. For your baby  Your first milk (colostrum) helps your baby's digestive system to function better.  Special cells in your milk (antibodies) help your baby to fight off infections.  Breastfed babies are less likely to develop asthma, allergies, obesity, or type 2 diabetes. They are also at lower risk for sudden infant death syndrome (SIDS).  Nutrients in breast milk are better able to meet your baby's needs compared to infant formula.  Breast milk improves your baby's brain development. For you  Breastfeeding helps to create a very special bond between you and your baby.  Breastfeeding is convenient. Breast milk costs nothing and is always available at the correct temperature.  Breastfeeding helps to burn calories. It helps you to lose the weight that you gained during pregnancy.  Breastfeeding makes your uterus return faster to its size before pregnancy. It also slows bleeding (lochia) after you give birth.  Breastfeeding helps to lower your risk of developing type 2 diabetes, osteoporosis, rheumatoid arthritis, cardiovascular disease, and breast, ovarian, uterine, and endometrial cancer later in life. Breastfeeding basics Starting  breastfeeding  Find a comfortable place to sit or lie down, with your neck and back well-supported.  Place  a pillow or a rolled-up blanket under your baby to bring him or her to the level of your breast (if you are seated). Nursing pillows are specially designed to help support your arms and your baby while you breastfeed.  Make sure that your baby's tummy (abdomen) is facing your abdomen.  Gently massage your breast. With your fingertips, massage from the outer edges of your breast inward toward the nipple. This encourages milk flow. If your milk flows slowly, you may need to continue this action during the feeding.  Support your breast with 4 fingers underneath and your thumb above your nipple (make the letter "C" with your hand). Make sure your fingers are well away from your nipple and your baby's mouth.  Stroke your baby's lips gently with your finger or nipple.  When your baby's mouth is open wide enough, quickly bring your baby to your breast, placing your entire nipple and as much of the areola as possible into your baby's mouth. The areola is the colored area around your nipple. ? More areola should be visible above your baby's upper lip than below the lower lip. ? Your baby's lips should be opened and extended outward (flanged) to ensure an adequate, comfortable latch. ? Your baby's tongue should be between his or her lower gum and your breast.  Make sure that your baby's mouth is correctly positioned around your nipple (latched). Your baby's lips should create a seal on your breast and be turned out (everted).  It is common for your baby to suck about 2-3 minutes in order to start the flow of breast milk. Latching Teaching your baby how to latch onto your breast properly is very important. An improper latch can cause nipple pain, decreased milk supply, and poor weight gain in your baby. Also, if your baby is not latched onto your nipple properly, he or she may swallow some air  during feeding. This can make your baby fussy. Burping your baby when you switch breasts during the feeding can help to get rid of the air. However, teaching your baby to latch on properly is still the best way to prevent fussiness from swallowing air while breastfeeding. Signs that your baby has successfully latched onto your nipple  Silent tugging or silent sucking, without causing you pain. Infant's lips should be extended outward (flanged).  Swallowing heard between every 3-4 sucks once your milk has started to flow (after your let-down milk reflex occurs).  Muscle movement above and in front of his or her ears while sucking. Signs that your baby has not successfully latched onto your nipple  Sucking sounds or smacking sounds from your baby while breastfeeding.  Nipple pain. If you think your baby has not latched on correctly, slip your finger into the corner of your baby's mouth to break the suction and place it between your baby's gums. Attempt to start breastfeeding again. Signs of successful breastfeeding Signs from your baby  Your baby will gradually decrease the number of sucks or will completely stop sucking.  Your baby will fall asleep.  Your baby's body will relax.  Your baby will retain a small amount of milk in his or her mouth.  Your baby will let go of your breast by himself or herself. Signs from you  Breasts that have increased in firmness, weight, and size 1-3 hours after feeding.  Breasts that are softer immediately after breastfeeding.  Increased milk volume, as well as a change in milk consistency and color by the fifth  day of breastfeeding.  Nipples that are not sore, cracked, or bleeding. Signs that your baby is getting enough milk  Wetting at least 1-2 diapers during the first 24 hours after birth.  Wetting at least 5-6 diapers every 24 hours for the first week after birth. The urine should be clear or pale yellow by the age of 5 days.  Wetting 6-8  diapers every 24 hours as your baby continues to grow and develop.  At least 3 stools in a 24-hour period by the age of 5 days. The stool should be soft and yellow.  At least 3 stools in a 24-hour period by the age of 7 days. The stool should be seedy and yellow.  No loss of weight greater than 10% of birth weight during the first 3 days of life.  Average weight gain of 4-7 oz (113-198 g) per week after the age of 4 days.  Consistent daily weight gain by the age of 5 days, without weight loss after the age of 2 weeks. After a feeding, your baby may spit up a small amount of milk. This is normal. Breastfeeding frequency and duration Frequent feeding will help you make more milk and can prevent sore nipples and extremely full breasts (breast engorgement). Breastfeed when you feel the need to reduce the fullness of your breasts or when your baby shows signs of hunger. This is called "breastfeeding on demand." Signs that your baby is hungry include:  Increased alertness, activity, or restlessness.  Movement of the head from side to side.  Opening of the mouth when the corner of the mouth or cheek is stroked (rooting).  Increased sucking sounds, smacking lips, cooing, sighing, or squeaking.  Hand-to-mouth movements and sucking on fingers or hands.  Fussing or crying. Avoid introducing a pacifier to your baby in the first 4-6 weeks after your baby is born. After this time, you may choose to use a pacifier. Research has shown that pacifier use during the first year of a baby's life decreases the risk of sudden infant death syndrome (SIDS). Allow your baby to feed on each breast as long as he or she wants. When your baby unlatches or falls asleep while feeding from the first breast, offer the second breast. Because newborns are often sleepy in the first few weeks of life, you may need to awaken your baby to get him or her to feed. Breastfeeding times will vary from baby to baby. However, the  following rules can serve as a guide to help you make sure that your baby is properly fed:  Newborns (babies 20 weeks of age or younger) may breastfeed every 1-3 hours.  Newborns should not go without breastfeeding for longer than 3 hours during the day or 5 hours during the night.  You should breastfeed your baby a minimum of 8 times in a 24-hour period. Breast milk pumping Pumping and storing breast milk allows you to make sure that your baby is exclusively fed your breast milk, even at times when you are unable to breastfeed. This is especially important if you go back to work while you are still breastfeeding, or if you are not able to be present during feedings. Your lactation consultant can help you find a method of pumping that works best for you and give you guidelines about how long it is safe to store breast milk.      Caring for your breasts while you breastfeed Nipples can become dry, cracked, and sore while breastfeeding. The  following recommendations can help keep your breasts moisturized and healthy:  Avoid using soap on your nipples.  Wear a supportive bra designed especially for nursing. Avoid wearing underwire-style bras or extremely tight bras (sports bras).  Air-dry your nipples for 3-4 minutes after each feeding.  Use only cotton bra pads to absorb leaked breast milk. Leaking of breast milk between feedings is normal.  Use lanolin on your nipples after breastfeeding. Lanolin helps to maintain your skin's normal moisture barrier. Pure lanolin is not harmful (not toxic) to your baby. You may also hand express a few drops of breast milk and gently massage that milk into your nipples and allow the milk to air-dry. In the first few weeks after giving birth, some women experience breast engorgement. Engorgement can make your breasts feel heavy, warm, and tender to the touch. Engorgement peaks within 3-5 days after you give birth. The following recommendations can help to ease  engorgement:  Completely empty your breasts while breastfeeding or pumping. You may want to start by applying warm, moist heat (in the shower or with warm, water-soaked hand towels) just before feeding or pumping. This increases circulation and helps the milk flow. If your baby does not completely empty your breasts while breastfeeding, pump any extra milk after he or she is finished.  Apply ice packs to your breasts immediately after breastfeeding or pumping, unless this is too uncomfortable for you. To do this: ? Put ice in a plastic bag. ? Place a towel between your skin and the bag. ? Leave the ice on for 20 minutes, 2-3 times a day.  Make sure that your baby is latched on and positioned properly while breastfeeding. If engorgement persists after 48 hours of following these recommendations, contact your health care provider or a Science writer. Overall health care recommendations while breastfeeding  Eat 3 healthy meals and 3 snacks every day. Well-nourished mothers who are breastfeeding need an additional 450-500 calories a day. You can meet this requirement by increasing the amount of a balanced diet that you eat.  Drink enough water to keep your urine pale yellow or clear.  Rest often, relax, and continue to take your prenatal vitamins to prevent fatigue, stress, and low vitamin and mineral levels in your body (nutrient deficiencies).  Do not use any products that contain nicotine or tobacco, such as cigarettes and e-cigarettes. Your baby may be harmed by chemicals from cigarettes that pass into breast milk and exposure to secondhand smoke. If you need help quitting, ask your health care provider.  Avoid alcohol.  Do not use illegal drugs or marijuana.  Talk with your health care provider before taking any medicines. These include over-the-counter and prescription medicines as well as vitamins and herbal supplements. Some medicines that may be harmful to your baby can pass  through breast milk.  It is possible to become pregnant while breastfeeding. If birth control is desired, ask your health care provider about options that will be safe while breastfeeding your baby. Where to find more information: [employer]: www.llli.org Contact a health care provider if:  You feel like you want to stop breastfeeding or have become frustrated with breastfeeding.  Your nipples are cracked or bleeding.  Your breasts are red, tender, or warm.  You have: ? Painful breasts or nipples. ? A swollen area on either breast. ? A fever or chills. ? Nausea or vomiting. ? Drainage other than breast milk from your nipples.  Your breasts do not become full before  feedings by the fifth day after you give birth.  You feel sad and depressed.  Your baby is: ? Too sleepy to eat well. ? Having trouble sleeping. ? More than 34 week old and wetting fewer than 6 diapers in a 24-hour period. ? Not gaining weight by 70 days of age.  Your baby has fewer than 3 stools in a 24-hour period.  Your baby's skin or the white parts of his or her eyes become yellow. Get help right away if:  Your baby is overly tired (lethargic) and does not want to wake up and feed.  Your baby develops an unexplained fever. Summary  Breastfeeding offers many health benefits for infant and mothers.  Try to breastfeed your infant when he or she shows early signs of hunger.  Gently tickle or stroke your baby's lips with your finger or nipple to allow the baby to open his or her mouth. Bring the baby to your breast. Make sure that much of the areola is in your baby's mouth. Offer one side and burp the baby before you offer the other side.  Talk with your health care provider or lactation consultant if you have questions or you face problems as you breastfeed. This information is not intended to replace advice given to you by your health care provider. Make sure you discuss any questions you  have with your health care provider. Document Revised: 06/21/2017 Document Reviewed: 04/28/2016 Elsevier Patient Education  2021 [employer] Tdap (Tetanus, Diphtheria, Pertussis) Vaccine: What You Need to Know 1. Why get vaccinated? Tdap vaccine can prevent tetanus, diphtheria, and pertussis. Diphtheria and pertussis spread from person to person. Tetanus enters the body through cuts or wounds.  TETANUS (T) causes painful stiffening of the muscles. Tetanus can lead to serious health problems, including being unable to open the mouth, having trouble swallowing and breathing, or death.  DIPHTHERIA (D) can lead to difficulty breathing, heart failure, paralysis, or death.  PERTUSSIS (aP), also known as "whooping cough," can cause uncontrollable, violent coughing that makes it hard to breathe, eat, or drink. Pertussis can be extremely serious especially in babies and young children, causing pneumonia, convulsions, brain damage, or death. In teens and adults, it can cause weight loss, loss of bladder control, passing out, and rib fractures from severe coughing. 2. Tdap vaccine Tdap is only for children 7 years and older, adolescents, and adults.  Adolescents should receive a single dose of Tdap, preferably at age 24 or 12 years. Pregnant people should get a dose of Tdap during every pregnancy, preferably during the early part of the third trimester, to help protect the newborn from pertussis. Infants are most at risk for severe, life-threatening complications from pertussis. Adults who have never received Tdap should get a dose of Tdap. Also, adults should receive a booster dose of either Tdap or Td (a different vaccine that protects against tetanus and diphtheria but not pertussis) every 10 years, or after 5 years in the case of a severe or dirty wound or burn. Tdap may be given at the same time as other vaccines. 3. Talk with your health care provider Tell your vaccine provider if the person  getting the vaccine:  Has had an allergic reaction after a previous dose of any vaccine that protects against tetanus, diphtheria, or pertussis, or has any severe, life-threatening allergies  Has had a coma, decreased level of consciousness, or prolonged seizures within 7 days after a previous dose of any pertussis vaccine (DTP, DTaP, or Tdap)  Has seizures or another nervous system problem  Has ever had Guillain-Barr Syndrome (also called "GBS")  Has had severe pain or swelling after a previous dose of any vaccine that protects against tetanus or diphtheria In some cases, your health care provider may decide to postpone Tdap vaccination until a future visit. People with minor illnesses, such as a cold, may be vaccinated. People who are moderately or severely ill should usually wait until they recover before getting Tdap vaccine.  Your health care provider can give you more information. 4. Risks of a vaccine reaction  Pain, redness, or swelling where the shot was given, mild fever, headache, feeling tired, and nausea, vomiting, diarrhea, or stomachache sometimes happen after Tdap vaccination. People sometimes faint after medical procedures, including vaccination. Tell your provider if you feel dizzy or have vision changes or ringing in the ears.  As with any medicine, there is a very remote chance of a vaccine causing a severe allergic reaction, other serious injury, or death. 5. What if there is a serious problem? An allergic reaction could occur after the vaccinated person leaves the clinic. If you see signs of a severe allergic reaction (hives, swelling of the face and throat, difficulty breathing, a fast heartbeat, dizziness, or weakness), call 9-1-1 and get the person to the nearest hospital. For other signs that concern you, call your health care provider.  Adverse reactions should be reported to the Vaccine Adverse Event Reporting System (VAERS). Your health care provider will usually  file this report, or you can do it yourself. Visit the VAERS website at www.vaers.SamedayNews.es or call (586) 022-9653. VAERS is only for reporting reactions, and VAERS staff members do not give medical advice. 6. The National Vaccine Injury Compensation Program The [employer] Vaccine Injury Compensation Program (VICP) is a federal program that was created to compensate people who may have been injured by certain vaccines. Claims regarding alleged injury or death due to vaccination have a time limit for filing, which may be as short as two years. Visit the VICP website at GoldCloset.com.ee or call 248-457-7660 to learn about the program and about filing a claim. 7. How can I learn more?  Ask your health care provider.  Call your local or state health department.  Visit the website of the Food and Drug Administration (FDA) for vaccine package inserts and additional information at TraderRating.uy.  Contact the Centers for Disease Control and Prevention (CDC): ? Call 262-293-0455 (1-800-CDC-INFO) or ? Visit CDC's website at http://hunter.com/. Vaccine Information Statement Tdap (Tetanus, Diphtheria, Pertussis) Vaccine (11/14/2019) This information is not intended to replace advice given to you by your health care provider. Make sure you discuss any questions you have with your health care provider. Document Revised: 12/10/2019 Document Reviewed: 12/10/2019 Elsevier Patient Education  [place]. WHAT OB PATIENTS CAN EXPECT   Confirmation of pregnancy and ultrasound ordered if medically indicated-[redacted] weeks gestation  New OB (NOB) intake with nurse and New OB (NOB) labs- [redacted] weeks gestation  New OB (NOB) physical examination with provider- 11/[redacted] weeks gestation  Flu vaccine-[redacted] weeks gestation  Anatomy scan-[redacted] weeks gestation  Glucose tolerance test, blood work to test for anemia, T-dap vaccine-[redacted] weeks gestation  Vaginal  swabs/cultures-STD/Group B strep-[redacted] weeks gestation  Appointments every 4 weeks until 28 weeks  Every 2 weeks from 28 weeks until 36 weeks  Weekly visits from 36 weeks until delivery

## 2020-06-10 NOTE — Progress Notes (Signed)
ROB: Doing well, no complaints.  For 28 week labs today.  Desires to breastfeed,  Desires unsure method  for contraception. For Tdap and rhogam today.  Signed blood consent form.  RTC in 2 weeks. Desires to receive flu vaccine at that visit.    The following were addressed during this visit:  Breastfeeding Education - Early initiation of breastfeeding    Comments: Keeps milk supply adequate, helps contract uterus and slow bleeding, and early milk is the perfect first food and is easy to digest.   - Nonpharmacological pain relief methods for labor    Comments: Deep breathing, focusing on pleasant things, movement and walking, heating pads or cold compress, massage and relaxation, continuous support from someone you trust, and Doulas   - The importance of early skin-to-skin contact    Comments: Keeps baby warm and secure, helps keep baby's blood sugar up and breathing steady, easier to bond and breastfeed, and helps calm baby.  - Rooming-in on a 24-hour basis    Comments: Easier to learn baby's feeding cues, easier to bond and get to know each other, and encourages milk production.   - Feeding on demand or baby-led feeding    Comments: Helps prevent breastfeeding complications, helps bring in good milk supply, prevents under or overfeeding, and helps baby feel content and satisfied   - Effective positioning and attachment    Comments: Helps my baby to get enough breast milk, helps to produce an adequate milk supply, and helps prevent nipple pain and damage

## 2020-06-10 NOTE — Addendum Note (Signed)
Addended by: Silvano Bilis on: 06/10/2020 12:24 PM   Modules accepted: Orders

## 2020-06-11 LAB — CBC
Hematocrit: 34.5 % (ref 34.0–46.6)
Hemoglobin: 11.6 g/dL (ref 11.1–15.9)
MCH: 29.6 pg (ref 26.6–33.0)
MCHC: 33.6 g/dL (ref 31.5–35.7)
MCV: 88 fL (ref 79–97)
Platelets: 300 10*3/uL (ref 150–450)
RBC: 3.92 x10E6/uL (ref 3.77–5.28)
RDW: 12.8 % (ref 11.7–15.4)
WBC: 10.2 10*3/uL (ref 3.4–10.8)

## 2020-06-11 LAB — RPR: RPR Ser Ql: NONREACTIVE

## 2020-06-11 LAB — GLUCOSE, 1 HOUR GESTATIONAL: Gestational Diabetes Screen: 131 mg/dL (ref 65–139)

## 2020-06-23 ENCOUNTER — Ambulatory Visit (INDEPENDENT_AMBULATORY_CARE_PROVIDER_SITE_OTHER): Payer: BC Managed Care – PPO | Admitting: Obstetrics and Gynecology

## 2020-06-23 ENCOUNTER — Encounter: Payer: Self-pay | Admitting: Obstetrics and Gynecology

## 2020-06-23 ENCOUNTER — Other Ambulatory Visit: Payer: Self-pay

## 2020-06-23 VITALS — BP 93/67 | HR 110 | Wt 196.2 lb

## 2020-06-23 DIAGNOSIS — Z3A29 29 weeks gestation of pregnancy: Secondary | ICD-10-CM

## 2020-06-23 DIAGNOSIS — Z3483 Encounter for supervision of other normal pregnancy, third trimester: Secondary | ICD-10-CM

## 2020-06-23 LAB — POCT URINALYSIS DIPSTICK OB
Bilirubin, UA: NEGATIVE
Blood, UA: NEGATIVE
Glucose, UA: NEGATIVE
Ketones, UA: NEGATIVE
Leukocytes, UA: NEGATIVE
Nitrite, UA: NEGATIVE
POC,PROTEIN,UA: NEGATIVE
Spec Grav, UA: 1.015 (ref 1.010–1.025)
Urobilinogen, UA: 0.2 E.U./dL
pH, UA: 7 (ref 5.0–8.0)

## 2020-06-23 NOTE — Progress Notes (Signed)
ROB: No complaints-no issues.  Daily fetal movement.  Labs reviewed.

## 2020-07-13 NOTE — Patient Instructions (Signed)

## 2020-07-14 ENCOUNTER — Other Ambulatory Visit: Payer: Self-pay

## 2020-07-14 ENCOUNTER — Ambulatory Visit (INDEPENDENT_AMBULATORY_CARE_PROVIDER_SITE_OTHER): Payer: BC Managed Care – PPO | Admitting: Obstetrics and Gynecology

## 2020-07-14 ENCOUNTER — Encounter: Payer: Self-pay | Admitting: Obstetrics and Gynecology

## 2020-07-14 VITALS — BP 107/73 | HR 101 | Ht 64.0 in | Wt 205.3 lb

## 2020-07-14 DIAGNOSIS — Z3A32 32 weeks gestation of pregnancy: Secondary | ICD-10-CM

## 2020-07-14 DIAGNOSIS — J302 Other seasonal allergic rhinitis: Secondary | ICD-10-CM

## 2020-07-14 DIAGNOSIS — Z3483 Encounter for supervision of other normal pregnancy, third trimester: Secondary | ICD-10-CM

## 2020-07-14 LAB — POCT URINALYSIS DIPSTICK OB
Bilirubin, UA: NEGATIVE
Blood, UA: NEGATIVE
Glucose, UA: NEGATIVE
Ketones, UA: NEGATIVE
Nitrite, UA: NEGATIVE
POC,PROTEIN,UA: NEGATIVE
Spec Grav, UA: 1.025 (ref 1.010–1.025)
Urobilinogen, UA: 0.2 E.U./dL
pH, UA: 6 (ref 5.0–8.0)

## 2020-07-14 NOTE — Progress Notes (Signed)
OB-pt present for routine prenatal care. Pt stated that she was doing well.  

## 2020-07-14 NOTE — Progress Notes (Signed)
ROB: Reports some issues with seasonal allergies.  Took Mucinex yesterday.  Advised on other OTC allergy meds and nasal saline spray. RTC in 2 weeks.

## 2020-07-28 ENCOUNTER — Encounter: Payer: Self-pay | Admitting: Obstetrics and Gynecology

## 2020-07-28 ENCOUNTER — Other Ambulatory Visit: Payer: Self-pay

## 2020-07-28 ENCOUNTER — Ambulatory Visit (INDEPENDENT_AMBULATORY_CARE_PROVIDER_SITE_OTHER): Payer: BC Managed Care – PPO | Admitting: Obstetrics and Gynecology

## 2020-07-28 VITALS — BP 93/66 | HR 94 | Wt 206.6 lb

## 2020-07-28 DIAGNOSIS — Z3A34 34 weeks gestation of pregnancy: Secondary | ICD-10-CM

## 2020-07-28 DIAGNOSIS — Z3483 Encounter for supervision of other normal pregnancy, third trimester: Secondary | ICD-10-CM

## 2020-07-28 LAB — POCT URINALYSIS DIPSTICK OB
Bilirubin, UA: NEGATIVE
Glucose, UA: NEGATIVE
Ketones, UA: NEGATIVE
Leukocytes, UA: NEGATIVE
Nitrite, UA: NEGATIVE
Spec Grav, UA: 1.02 (ref 1.010–1.025)
Urobilinogen, UA: 0.2 E.U./dL
pH, UA: 6 (ref 5.0–8.0)

## 2020-07-28 NOTE — Progress Notes (Signed)
ROB-doing well, no concerns. 

## 2020-07-28 NOTE — Progress Notes (Signed)
ROB: She has no complaints.  Taking prenatal vitamins and aspirin as directed.  Cultures next visit.

## 2020-08-11 ENCOUNTER — Encounter: Payer: Self-pay | Admitting: Obstetrics and Gynecology

## 2020-08-11 ENCOUNTER — Other Ambulatory Visit: Payer: Self-pay

## 2020-08-11 ENCOUNTER — Ambulatory Visit (INDEPENDENT_AMBULATORY_CARE_PROVIDER_SITE_OTHER): Payer: BC Managed Care – PPO | Admitting: Obstetrics and Gynecology

## 2020-08-11 VITALS — BP 113/70 | HR 91 | Wt 212.7 lb

## 2020-08-11 DIAGNOSIS — Z3483 Encounter for supervision of other normal pregnancy, third trimester: Secondary | ICD-10-CM | POA: Diagnosis not present

## 2020-08-11 DIAGNOSIS — Z3A36 36 weeks gestation of pregnancy: Secondary | ICD-10-CM

## 2020-08-11 DIAGNOSIS — O09293 Supervision of pregnancy with other poor reproductive or obstetric history, third trimester: Secondary | ICD-10-CM

## 2020-08-11 DIAGNOSIS — Z3685 Encounter for antenatal screening for Streptococcus B: Secondary | ICD-10-CM

## 2020-08-11 LAB — POCT URINALYSIS DIPSTICK OB
Bilirubin, UA: NEGATIVE
Blood, UA: NEGATIVE
Glucose, UA: NEGATIVE
Ketones, UA: NEGATIVE
Nitrite, UA: NEGATIVE
POC,PROTEIN,UA: NEGATIVE
Spec Grav, UA: 1.025 (ref 1.010–1.025)
Urobilinogen, UA: 0.2 E.U./dL
pH, UA: 6 (ref 5.0–8.0)

## 2020-08-11 NOTE — Patient Instructions (Addendum)
Signs and Symptoms of Labor Labor is the body's natural process of moving the baby and the placenta out of the uterus. The process of labor usually starts when the baby is full-term, between 37 and 40 weeks of pregnancy. Signs and symptoms that you are close to going into labor As your body prepares for labor and the birth of your baby, you may notice the following symptoms in the weeks and days before true labor starts:  Passing a small amount of thick, bloody mucus from your vagina. This is called normal bloody show or losing your mucus plug. This may happen more than a week before labor begins, or right before labor begins, as the opening of the cervix starts to widen (dilate). For some women, the entire mucus plug passes at once. For others, pieces of the mucus plug may gradually pass over several days.  Your baby moving (dropping) lower in your pelvis to get into position for birth (lightening). When this happens, you may feel more pressure on your bladder and pelvic bone and less pressure on your ribs. This may make it easier to breathe. It may also cause you to need to urinate more often and have problems with bowel movements.  Having "practice contractions," also called Braxton Hicks contractions or false labor. These occur at irregular (unevenly spaced) intervals that are more than 10 minutes apart. False labor contractions are common after exercise or sexual activity. They will stop if you change position, rest, or drink fluids. These contractions are usually mild and do not get stronger over time. They may feel like: ? A backache or back pain. ? Mild cramps, similar to menstrual cramps. ? Tightening or pressure in your abdomen. Other early symptoms include:  Nausea or loss of appetite.  Diarrhea.  Having a sudden burst of energy, or feeling very tired.  Mood changes.  Having trouble sleeping.   Signs and symptoms that labor has begun Signs that you are in labor may  include:  Having contractions that come at regular (evenly spaced) intervals and increase in intensity. This may feel like more intense tightening or pressure in your abdomen that moves to your back. ? Contractions may also feel like rhythmic pain in your upper thighs or back that comes and goes at regular intervals. ? For first-time mothers, this change in intensity of contractions often occurs at a more gradual pace. ? Women who have given birth before may notice a more rapid progression of contraction changes.  Feeling pressure in the vaginal area.  Your water breaking (rupture of membranes). This is when the sac of fluid that surrounds your baby breaks. Fluid leaking from your vagina may be clear or blood-tinged. Labor usually starts within 24 hours of your water breaking, but it may take longer to begin. ? Some women may feel a sudden gush of fluid. ? Others notice that their underwear repeatedly becomes damp. Follow these instructions at home:  When labor starts, or if your water breaks, call your health care provider or nurse care line. Based on your situation, they will determine when you should go in for an exam.  During early labor, you may be able to rest and manage symptoms at home. Some strategies to try at home include: ? Breathing and relaxation techniques. ? Taking a warm bath or shower. ? Listening to music. ? Using a heating pad on the lower back for pain. If you are directed to use heat:  Place a towel between your skin and the   heat source.  Leave the heat on for 20-30 minutes.  Remove the heat if your skin turns bright red. This is especially important if you are unable to feel pain, heat, or cold. You may have a greater risk of getting burned.   Contact a health care provider if:  Your labor has started.  Your water breaks. Get help right away if:  You have painful, regular contractions that are 5 minutes apart or less.  Labor starts before you are [redacted] weeks  along in your pregnancy.  You have a fever.  You have bright red blood coming from your vagina.  You do not feel your baby moving.  You have a severe headache with or without vision problems.  You have severe nausea, vomiting, or diarrhea.  You have chest pain or shortness of breath. These symptoms may represent a serious problem that is an emergency. Do not wait to see if the symptoms will go away. Get medical help right away. Call your local emergency services (911 in the [place] Do not drive yourself to the hospital. Summary  Labor is your body's natural process of moving your baby and the placenta out of your uterus.  The process of labor usually starts when your baby is full-term, between 37 and 40 weeks of pregnancy.  When labor starts, or if your water breaks, call your health care provider or nurse care line. Based on your situation, they will determine when you should go in for an exam. This information is not intended to replace advice given to you by your health care provider. Make sure you discuss any questions you have with your health care provider. Document Revised: 01/17/2020 Document Reviewed: 01/17/2020 Elsevier Patient Education  2021 Elsevier Inc.   Group B Streptococcus Test During Pregnancy Why am I having this test? Routine testing, also called screening, for group B streptococcus (GBS) is recommended for all pregnant women between the 36th and 37th week of pregnancy. GBS is a type of bacteria that can be passed from mother to baby during childbirth. Screening will help guide whether or not you will need treatment during labor and delivery to prevent complications such as:  An infection in your uterus during labor.  An infection in your uterus after delivery.  A serious infection in your baby after delivery, such as pneumonia, meningitis, or sepsis. GBS screening is not often done before 36 weeks of pregnancy unless you go into labor prematurely. What  happens if I have group B streptococcus? If testing shows that you have GBS, your health care provider will recommend treatment with IV antibiotics during labor and delivery. This treatment significantly decreases the risk of complications for you and your baby. If you have a planned C-section and you have GBS, you may not need to be treated with antibiotics because GBS is usually passed to babies after labor starts and your water breaks. If you are in labor or your water breaks before your C-section, it is possible for GBS to get into your uterus and be passed to your baby, so you might need treatment. Is there a chance I may not need to be tested? You may not need to be tested for GBS if:  You have a urine test that shows GBS before 36 to 37 weeks.  You had a baby with GBS infection after a previous delivery. In these cases, you will automatically be treated for GBS during labor and delivery. What is being tested? This test is done to check   check if you have group B streptococcus in your vagina or rectum. What kind of sample is taken? To collect samples for this test, your health care provider will swab your vagina and rectum with a cotton swab. The sample is then sent to the lab to see if GBS is present. What happens during the test?  You will remove your clothing from the waist down.  You will lie down on an exam table in the same position as you would for a pelvic exam.  Your health care provider will swab your vagina and rectum to collect samples for a culture test.  You will be able to go home after the test and do all your usual activities.   How are the results reported? The test results are reported as positive or negative. What do the results mean?  A positive test means you are at risk for passing GBS to your baby during labor and delivery. Your health care provider will recommend that you are treated with an IV antibiotic during labor and delivery.  A negative test means you are  at very low risk of passing GBS to your baby. There is still a low risk of passing GBS to your baby because sometimes test results may report that you do not have a condition when you do (false-negative result) or there is a chance that you may become infected with GBS after the test is done. You most likely will not need to be treated with an antibiotic during labor and delivery. Talk with your health care provider about what your results mean. Questions to ask your health care provider Ask your health care provider, or the department that is doing the test:  When will my results be ready?  How will I get my results?  What are my treatment options? Summary  Routine testing (screening) for group B streptococcus (GBS) is recommended for all pregnant women between the 36th and 37th week of pregnancy.  GBS is a type of bacteria that can be passed from mother to baby during childbirth.  If testing shows that you have GBS, your health care provider will recommend that you are treated with IV antibiotics during labor and delivery. This treatment almost always prevents infection in newborns. This information is not intended to replace advice given to you by your health care provider. Make sure you discuss any questions you have with your health care provider. Document Revised: 01/27/2020 Document Reviewed: 04/24/2018 Elsevier Patient Education  2021 [employer].

## 2020-08-11 NOTE — Progress Notes (Signed)
OB-Pt present for routine prenatal care and 36 week cultures. Pt stated having vaginal swelling.

## 2020-08-11 NOTE — Progress Notes (Addendum)
Reports vaginal swelling last week, but did note she was standing more and working more.  No further issues this week. 36 week cultures performed. History of macrosomia, growth continues to appear normal. Signs and symptoms of labor discussed. RTC in 1 week.

## 2020-08-13 LAB — STREP GP B NAA: Strep Gp B NAA: NEGATIVE

## 2020-08-14 LAB — GC/CHLAMYDIA PROBE AMP
Chlamydia trachomatis, NAA: NEGATIVE
Neisseria Gonorrhoeae by PCR: NEGATIVE

## 2020-08-17 ENCOUNTER — Other Ambulatory Visit: Payer: Self-pay

## 2020-08-17 ENCOUNTER — Encounter: Payer: Self-pay | Admitting: Obstetrics and Gynecology

## 2020-08-17 ENCOUNTER — Ambulatory Visit (INDEPENDENT_AMBULATORY_CARE_PROVIDER_SITE_OTHER): Payer: BC Managed Care – PPO | Admitting: Obstetrics and Gynecology

## 2020-08-17 VITALS — BP 106/73 | HR 98 | Wt 211.7 lb

## 2020-08-17 DIAGNOSIS — Z3A37 37 weeks gestation of pregnancy: Secondary | ICD-10-CM

## 2020-08-17 DIAGNOSIS — Z3483 Encounter for supervision of other normal pregnancy, third trimester: Secondary | ICD-10-CM

## 2020-08-17 LAB — POCT URINALYSIS DIPSTICK OB
Bilirubin, UA: NEGATIVE
Blood, UA: NEGATIVE
Glucose, UA: NEGATIVE
Ketones, UA: NEGATIVE
Leukocytes, UA: NEGATIVE
Nitrite, UA: NEGATIVE
Spec Grav, UA: >=1.03 — AB (ref 1.010–1.025)
Urobilinogen, UA: 0.2 E.U./dL
pH, UA: 6 (ref 5.0–8.0)

## 2020-08-17 NOTE — Progress Notes (Signed)
ROB: Patient feels well-no complaints.  Daily fetal movement.  Taking aspirin as directed.  Culture results discussed.

## 2020-08-26 ENCOUNTER — Other Ambulatory Visit: Payer: Self-pay

## 2020-08-26 ENCOUNTER — Ambulatory Visit (INDEPENDENT_AMBULATORY_CARE_PROVIDER_SITE_OTHER): Payer: BC Managed Care – PPO | Admitting: Obstetrics and Gynecology

## 2020-08-26 ENCOUNTER — Encounter: Payer: Self-pay | Admitting: Obstetrics and Gynecology

## 2020-08-26 VITALS — BP 102/67 | HR 91 | Ht 64.0 in | Wt 214.3 lb

## 2020-08-26 DIAGNOSIS — Z3483 Encounter for supervision of other normal pregnancy, third trimester: Secondary | ICD-10-CM

## 2020-08-26 DIAGNOSIS — O09293 Supervision of pregnancy with other poor reproductive or obstetric history, third trimester: Secondary | ICD-10-CM

## 2020-08-26 DIAGNOSIS — Z3A38 38 weeks gestation of pregnancy: Secondary | ICD-10-CM

## 2020-08-26 LAB — POCT URINALYSIS DIPSTICK OB
Bilirubin, UA: NEGATIVE
Blood, UA: NEGATIVE
Glucose, UA: NEGATIVE
Ketones, UA: NEGATIVE
Leukocytes, UA: NEGATIVE
Nitrite, UA: NEGATIVE
POC,PROTEIN,UA: NEGATIVE
Spec Grav, UA: <=1.005 — AB (ref 1.010–1.025)
Urobilinogen, UA: 0.2 E.U./dL
pH, UA: 6 (ref 5.0–8.0)

## 2020-08-26 NOTE — Patient Instructions (Signed)

## 2020-08-26 NOTE — Progress Notes (Signed)
OB-Pt present for routine prenatal care. Pt stated that she was doing well.  

## 2020-08-26 NOTE — Progress Notes (Signed)
ROB: Doing well, no major complaints. Discussed signs/symptoms of labor.  RTC in 1 week.

## 2020-09-02 ENCOUNTER — Other Ambulatory Visit: Payer: Self-pay

## 2020-09-02 ENCOUNTER — Encounter: Payer: Self-pay | Admitting: Obstetrics and Gynecology

## 2020-09-02 ENCOUNTER — Ambulatory Visit (INDEPENDENT_AMBULATORY_CARE_PROVIDER_SITE_OTHER): Payer: BC Managed Care – PPO | Admitting: Obstetrics and Gynecology

## 2020-09-02 VITALS — BP 110/72 | HR 105 | Wt 218.0 lb

## 2020-09-02 DIAGNOSIS — Z3A39 39 weeks gestation of pregnancy: Secondary | ICD-10-CM

## 2020-09-02 DIAGNOSIS — Z3483 Encounter for supervision of other normal pregnancy, third trimester: Secondary | ICD-10-CM

## 2020-09-02 LAB — POCT URINALYSIS DIPSTICK OB
Bilirubin, UA: NEGATIVE
Blood, UA: NEGATIVE
Glucose, UA: NEGATIVE
Ketones, UA: NEGATIVE
Leukocytes, UA: NEGATIVE
Nitrite, UA: NEGATIVE
POC,PROTEIN,UA: NEGATIVE
Spec Grav, UA: 1.01 (ref 1.010–1.025)
Urobilinogen, UA: 0.2 E.U./dL
pH, UA: 6 (ref 5.0–8.0)

## 2020-09-02 NOTE — Progress Notes (Signed)
ROB: No complaints.  Reports daily fetal movement.  Induction scheduled for 6/2-5 AM.  COVID testing 5/31.  Induction discussed.

## 2020-09-02 NOTE — Addendum Note (Signed)
Addended by: Elonda Husky on: 09/02/2020 04:12 PM   Modules accepted: Orders, SmartSet

## 2020-09-04 ENCOUNTER — Other Ambulatory Visit: Payer: Self-pay

## 2020-09-04 ENCOUNTER — Inpatient Hospital Stay
Admission: EM | Admit: 2020-09-04 | Discharge: 2020-09-05 | DRG: 807 | Disposition: A | Discharge status: Home / Self Care | Payer: BC Managed Care – PPO | Attending: Obstetrics and Gynecology | Admitting: Obstetrics and Gynecology

## 2020-09-04 ENCOUNTER — Inpatient Hospital Stay: Payer: BC Managed Care – PPO | Admitting: Anesthesiology

## 2020-09-04 ENCOUNTER — Encounter: Payer: Self-pay | Admitting: Obstetrics and Gynecology

## 2020-09-04 DIAGNOSIS — Z141 Cystic fibrosis carrier: Secondary | ICD-10-CM

## 2020-09-04 DIAGNOSIS — Z20822 Contact with and (suspected) exposure to covid-19: Secondary | ICD-10-CM | POA: Diagnosis present

## 2020-09-04 DIAGNOSIS — O26893 Other specified pregnancy related conditions, third trimester: Secondary | ICD-10-CM | POA: Diagnosis not present

## 2020-09-04 DIAGNOSIS — Z3490 Encounter for supervision of normal pregnancy, unspecified, unspecified trimester: Secondary | ICD-10-CM

## 2020-09-04 DIAGNOSIS — Z6791 Unspecified blood type, Rh negative: Secondary | ICD-10-CM | POA: Diagnosis not present

## 2020-09-04 DIAGNOSIS — O480 Post-term pregnancy: Secondary | ICD-10-CM | POA: Diagnosis present

## 2020-09-04 DIAGNOSIS — O99214 Obesity complicating childbirth: Secondary | ICD-10-CM | POA: Diagnosis present

## 2020-09-04 DIAGNOSIS — Z3A39 39 weeks gestation of pregnancy: Secondary | ICD-10-CM

## 2020-09-04 DIAGNOSIS — O9902 Anemia complicating childbirth: Secondary | ICD-10-CM | POA: Diagnosis not present

## 2020-09-04 LAB — RESP PANEL BY RT-PCR (FLU A&B, COVID) ARPGX2
Influenza A by PCR: NEGATIVE
Influenza B by PCR: NEGATIVE
SARS Coronavirus 2 by RT PCR: NEGATIVE

## 2020-09-04 LAB — CBC
HCT: 36.4 % (ref 36.0–46.0)
Hemoglobin: 11.9 g/dL — ABNORMAL LOW (ref 12.0–15.0)
MCH: 27.1 pg (ref 26.0–34.0)
MCHC: 32.7 g/dL (ref 30.0–36.0)
MCV: 82.9 fL (ref 80.0–100.0)
Platelets: 435 10*3/uL — ABNORMAL HIGH (ref 150–400)
RBC: 4.39 MIL/uL (ref 3.87–5.11)
RDW: 13.9 % (ref 11.5–15.5)
WBC: 16.4 10*3/uL — ABNORMAL HIGH (ref 4.0–10.5)
nRBC: 0 % (ref 0.0–0.2)

## 2020-09-04 LAB — RPR: RPR Ser Ql: NONREACTIVE

## 2020-09-04 LAB — TYPE AND SCREEN
ABO/RH(D): O NEG
Antibody Screen: POSITIVE

## 2020-09-04 MED ORDER — SODIUM CHLORIDE 0.9 % IV SOLN: 2.0000 g | Freq: Once | INTRAVENOUS | Status: DC

## 2020-09-04 MED ORDER — IBUPROFEN 600 MG PO TABS
600.0000 mg | ORAL_TABLET | Freq: Four times a day (QID) | ORAL | Status: DC
  Administered 2020-09-04 – 2020-09-05 (×4): 600 mg via ORAL
  Filled 2020-09-04 (×4): qty 1

## 2020-09-04 MED ORDER — WITCH HAZEL-GLYCERIN EX PADS
1.0000 | MEDICATED_PAD | CUTANEOUS | Status: DC | PRN
Start: 2020-09-04 — End: 2020-09-05
  Administered 2020-09-04: 1 via TOPICAL
  Filled 2020-09-04: qty 100

## 2020-09-04 MED ORDER — OXYTOCIN BOLUS FROM INFUSION
333.0000 mL | Freq: Once | INTRAVENOUS | Status: AC
  Administered 2020-09-04: 333 mL via INTRAVENOUS

## 2020-09-04 MED ORDER — DIPHENHYDRAMINE HCL 50 MG/ML IJ SOLN: 12.5000 mg | INTRAMUSCULAR | Status: DC | PRN

## 2020-09-04 MED ORDER — BENZOCAINE-MENTHOL 20-0.5 % EX AERO
1.0000 "application " | INHALATION_SPRAY | CUTANEOUS | Status: DC | PRN
  Administered 2020-09-04: 1 via TOPICAL
  Filled 2020-09-04: qty 56

## 2020-09-04 MED ORDER — FENTANYL 2.5 MCG/ML W/ROPIVACAINE 0.15% IN NS 100 ML EPIDURAL (ARMC)
EPIDURAL | Status: AC
  Filled 2020-09-04: qty 100

## 2020-09-04 MED ORDER — LIDOCAINE-EPINEPHRINE (PF) 1.5 %-1:200000 IJ SOLN
INTRAMUSCULAR | Status: DC | PRN
  Administered 2020-09-04: 3 mL via EPIDURAL

## 2020-09-04 MED ORDER — OXYTOCIN 10 UNIT/ML IJ SOLN
INTRAMUSCULAR | Status: AC
  Filled 2020-09-04: qty 2

## 2020-09-04 MED ORDER — LACTATED RINGERS IV SOLN: INTRAVENOUS | Status: DC

## 2020-09-04 MED ORDER — LACTATED RINGERS IV SOLN: 500.0000 mL | Freq: Once | INTRAVENOUS | Status: DC

## 2020-09-04 MED ORDER — AMMONIA AROMATIC IN INHA
RESPIRATORY_TRACT | Status: AC
  Filled 2020-09-04: qty 10

## 2020-09-04 MED ORDER — FENTANYL 2.5 MCG/ML W/ROPIVACAINE 0.15% IN NS 100 ML EPIDURAL (ARMC): 12.0000 mL/h | EPIDURAL | Status: DC

## 2020-09-04 MED ORDER — LIDOCAINE HCL (PF) 1 % IJ SOLN: 30.0000 mL | INTRAMUSCULAR | Status: DC | PRN

## 2020-09-04 MED ORDER — DIBUCAINE (PERIANAL) 1 % EX OINT
1.0000 "application " | TOPICAL_OINTMENT | CUTANEOUS | Status: DC | PRN
  Administered 2020-09-04: 1 via RECTAL
  Filled 2020-09-04: qty 28

## 2020-09-04 MED ORDER — FENTANYL 2.5 MCG/ML W/ROPIVACAINE 0.15% IN NS 100 ML EPIDURAL (ARMC)
EPIDURAL | Status: DC | PRN
  Administered 2020-09-04: 12 mL/h via EPIDURAL

## 2020-09-04 MED ORDER — BUTORPHANOL TARTRATE 1 MG/ML IJ SOLN: 1.0000 mg | INTRAMUSCULAR | Status: DC | PRN

## 2020-09-04 MED ORDER — ZOLPIDEM TARTRATE 5 MG PO TABS: 5.0000 mg | ORAL_TABLET | Freq: Every evening | ORAL | Status: DC | PRN

## 2020-09-04 MED ORDER — SIMETHICONE 80 MG PO CHEW: 80.0000 mg | CHEWABLE_TABLET | ORAL | Status: DC | PRN

## 2020-09-04 MED ORDER — OXYTOCIN-SODIUM CHLORIDE 30-0.9 UT/500ML-% IV SOLN
INTRAVENOUS | Status: AC
  Administered 2020-09-04: 2.5 [IU]/h via INTRAVENOUS
  Filled 2020-09-04: qty 500

## 2020-09-04 MED ORDER — MISOPROSTOL 50MCG HALF TABLET: 50.0000 ug | ORAL_TABLET | ORAL | Status: DC | PRN

## 2020-09-04 MED ORDER — OXYTOCIN-SODIUM CHLORIDE 30-0.9 UT/500ML-% IV SOLN
2.5000 [IU]/h | INTRAVENOUS | Status: DC
  Filled 2020-09-04: qty 500

## 2020-09-04 MED ORDER — LIDOCAINE HCL (PF) 1 % IJ SOLN
INTRAMUSCULAR | Status: DC | PRN
  Administered 2020-09-04: 3 mL via SUBCUTANEOUS

## 2020-09-04 MED ORDER — ONDANSETRON HCL 4 MG/2ML IJ SOLN
4.0000 mg | Freq: Four times a day (QID) | INTRAMUSCULAR | Status: DC | PRN
  Administered 2020-09-04: 4 mg via INTRAVENOUS
  Filled 2020-09-04: qty 2

## 2020-09-04 MED ORDER — EPHEDRINE 5 MG/ML INJ: 10.0000 mg | INTRAVENOUS | Status: DC | PRN

## 2020-09-04 MED ORDER — LIDOCAINE HCL (PF) 1 % IJ SOLN
INTRAMUSCULAR | Status: AC
  Filled 2020-09-04: qty 30

## 2020-09-04 MED ORDER — PHENYLEPHRINE 40 MCG/ML (10ML) SYRINGE FOR IV PUSH (FOR BLOOD PRESSURE SUPPORT)
80.0000 ug | PREFILLED_SYRINGE | INTRAVENOUS | Status: DC | PRN
  Filled 2020-09-04: qty 10

## 2020-09-04 MED ORDER — ACETAMINOPHEN 325 MG PO TABS
650.0000 mg | ORAL_TABLET | ORAL | Status: DC | PRN
  Administered 2020-09-04 – 2020-09-05 (×2): 650 mg via ORAL
  Filled 2020-09-04 (×2): qty 2

## 2020-09-04 MED ORDER — SOD CITRATE-CITRIC ACID 500-334 MG/5ML PO SOLN: 30.0000 mL | ORAL | Status: DC | PRN

## 2020-09-04 MED ORDER — PHENYLEPHRINE 40 MCG/ML (10ML) SYRINGE FOR IV PUSH (FOR BLOOD PRESSURE SUPPORT): 80.0000 ug | PREFILLED_SYRINGE | INTRAVENOUS | Status: DC | PRN

## 2020-09-04 MED ORDER — ONDANSETRON HCL 4 MG PO TABS
4.0000 mg | ORAL_TABLET | ORAL | Status: DC | PRN
  Filled 2020-09-04: qty 1

## 2020-09-04 MED ORDER — MISOPROSTOL 200 MCG PO TABS
ORAL_TABLET | ORAL | Status: AC
  Filled 2020-09-04: qty 4

## 2020-09-04 MED ORDER — ONDANSETRON HCL 4 MG/2ML IJ SOLN: 4.0000 mg | INTRAMUSCULAR | Status: DC | PRN

## 2020-09-04 MED ORDER — LACTATED RINGERS IV SOLN: 500.0000 mL | INTRAVENOUS | Status: DC | PRN

## 2020-09-04 MED ORDER — DIPHENHYDRAMINE HCL 25 MG PO CAPS: 25.0000 mg | ORAL_CAPSULE | Freq: Four times a day (QID) | ORAL | Status: DC | PRN

## 2020-09-04 MED ORDER — ACETAMINOPHEN 325 MG PO TABS: 650.0000 mg | ORAL_TABLET | ORAL | Status: DC | PRN

## 2020-09-04 MED ORDER — SODIUM CHLORIDE 0.9 % IV SOLN
INTRAVENOUS | Status: DC | PRN
  Administered 2020-09-04: 10 mL via EPIDURAL

## 2020-09-04 MED ORDER — PRENATAL MULTIVITAMIN CH
1.0000 | ORAL_TABLET | Freq: Every day | ORAL | Status: DC
  Administered 2020-09-05: 1 via ORAL
  Filled 2020-09-04: qty 1

## 2020-09-04 MED ORDER — SODIUM CHLORIDE 0.9 % IV SOLN: 1.0000 g | INTRAVENOUS | Status: DC

## 2020-09-04 MED ORDER — SENNOSIDES-DOCUSATE SODIUM 8.6-50 MG PO TABS
2.0000 | ORAL_TABLET | Freq: Every day | ORAL | Status: DC
  Administered 2020-09-05: 2 via ORAL
  Filled 2020-09-04: qty 2

## 2020-09-04 MED ORDER — COCONUT OIL OIL
1.0000 "application " | TOPICAL_OIL | Status: DC | PRN
  Filled 2020-09-04: qty 120

## 2020-09-04 NOTE — Lactation Note (Signed)
This note was copied from a baby's chart. Lactation Consultation Note  Patient Name: Girl Kasie Leccese [id number] Date: 09/04/2020 Reason for consult: Initial assessment;Term Age:28 hours  Maternal Data Has patient been taught Hand Expression?: Yes Does the patient have breastfeeding experience prior to this delivery?: Yes How long did the patient breastfeed?: BF 28 year old for 8 to 9 months  Feeding Mother's Current Feeding Choice: Breast Milk  Observed mom breast feeding without assistance.  [place] breast fed well with strong, rhythmic sucking and occasional swallows.  She breast fed her 28 year old for 8 to 9 months without complications.  Mom has a Medela DEBP from her first baby.  Mom has [employer]. Encouraged mom to call [employer] and get another DEBP with this baby as well since it has been 4 years.  Mom does not plan to return to work for 12 weeks which gives her time to get breast feeding well established.  Hand out given on what to expect with breast feeding the first 4 days of life and reviewed normal newborn stomach size, feeding cues, adequate intake and out put, supply and demand, normal course of lactation and routine newborn feeding patterns.  Lactation Resource sheet given and reviewed support groups, informational web sites and contact numbers.  Lactation name and number written on white board and encouraged to call with any questions, concerns or assistance. LATCH Score Latch: Grasps breast easily, tongue down, lips flanged, rhythmical sucking.  Audible Swallowing: A few with stimulation  Type of Nipple: Everted at rest and after stimulation  Comfort (Breast/Nipple): Soft / non-tender  Hold (Positioning): No assistance needed to correctly position infant at breast.  LATCH Score: 9   Lactation Tools Discussed/Used    Interventions Interventions: Breast feeding basics reviewed;Skin to skin;Breast massage;Hand express;Breast compression;Support  pillows  Discharge Discharge Education: Other (comment) Pump: Personal;Advised to call insurance company (Has Medela DEBP from last baby.  Encouraged mom to call and get another DEBP through her BCBS again) WIC Program: Yes (Also has [employer]BS)  Consult Status Consult Status: PRN    Louis Meckel 09/04/2020, 9:38 PM

## 2020-09-04 NOTE — OB Triage Note (Signed)
Pt is a G2P1001 and [redacted]w[redacted]d presenting to L&D with c/o ctx that started around 0215 today. Pt states they have been 4-5 minutes apart and have since got closer. Pt rates ctx 5/10 on a 0-10 pain scale. Pt denies VB, LOF and confirms positive fetal movement. Monitors applied and assessing. VSS.

## 2020-09-04 NOTE — H&P (Signed)
Obstetric History and Physical  Jaime Mcintosh is a 30 y.o. G2P1001 with IUP at [redacted]w[redacted]d presenting for complaints of contractions. Patient states she has been having  regular, every 4-5 minutes contractions, no vaginal bleeding, intact membranes, with active fetal movement.    Prenatal Course Source of Care: [facility]  with onset of care at 8 weeks Pregnancy complications or risks: Patient Active Problem List   Diagnosis Date Noted  . Pregnancy 09/04/2020  . Post-dates pregnancy 09/04/2020  . Cystic fibrosis carrier 04/14/2020  . Hx of macrosomia in infant in prior pregnancy, currently pregnant 02/18/2020  . History of pre-eclampsia 02/18/2020  . History of migraine 01/07/2016  . Obesity (BMI 30.0-34.9) 01/07/2016   She plans to breastfeed She desires unsure method for postpartum contraception.   Prenatal labs and studies: ABO, Rh: --/--/O NEG (05/28 0406) Antibody: POS (05/28 0406) Rubella: 13.10 (10/18 0957) RPR: Non Reactive (03/03 0825)  HBsAg: Negative (10/18 0957)  HIV: Non Reactive (10/18 0957)  NLZ:JQBHALPF/-- (05/04 0902) 1 hr Glucola normal Genetic screening normal Anatomy US normal   Past Medical History:  Diagnosis Date  . Headache    hx migraines  . History of pre-eclampsia 06/2016   mild    Past Surgical History:  Procedure Laterality Date  . NO PAST SURGERIES      OB History  Gravida Para Term Preterm AB Living  2 1 1     1   SAB IAB Ectopic Multiple Live Births        0 1    # Outcome Date GA Lbr Len/2nd Weight Sex Delivery Anes PTL Lv  2 Current           1 Term 07/05/16 108w0d  4270 g M Vag-Spont EPI  LIV    Social History   Socioeconomic History  . Marital status: Married    Spouse name: Not on file  . Number of children: Not on file  . Years of education: Not on file  . Highest education level: Not on file  Occupational History  . Not on file  Tobacco Use  . Smoking status: Never Smoker  . Smokeless tobacco: Never Used   Vaping Use  . Vaping Use: Never used  Substance and Sexual Activity  . Alcohol use: Not Currently    Comment: occass  . Drug use: No  . Sexual activity: Yes    Partners: Female    Birth control/protection: None    Comment: undecided  Other Topics Concern  . Not on file  Social History Narrative   Works nights   Social Determinants of Health   Financial Resource Strain: Not on file  Food Insecurity: Not on file  Transportation Needs: Not on file  Physical Activity: Not on file  Stress: Not on file  Social Connections: Not on file    Family History  Problem Relation Age of Onset  . Heart disease Maternal Grandfather   . Diabetes Paternal Grandfather   . Arthritis Maternal Grandmother   . Hypertension Maternal Grandmother   . Varicose Veins Maternal Grandmother   . Vision loss Paternal Grandmother   . Healthy Mother   . Healthy Father   . Stroke Neg Hx   . Breast cancer Neg Hx     Medications Prior to Admission  Medication Sig Dispense Refill Last Dose  . aspirin EC 81 MG tablet Take 1 tablet (81 mg total) by mouth daily. Take after 12 weeks for prevention of preeclampssia later in pregnancy 300 tablet  2 09/03/2020 at Unknown time  . prenatal vitamin w/FE, FA (NATACHEW) 29-1 MG CHEW chewable tablet Chew 1 tablet by mouth daily at 12 noon.   09/03/2020 at Unknown time    No Known Allergies  Review of Systems: Negative except for what is mentioned in HPI.  Physical Exam: BP 108/65   Pulse 78   Temp 97.7 F (36.5 C) (Oral)   Resp 20   Ht 5\' 4"  (1.626 m)   LMP 12/01/2019   SpO2 99%   BMI 37.42 kg/m  CONSTITUTIONAL: Well-developed, well-nourished female in no acute distress.  HENT:  Normocephalic, atraumatic, External right and left ear normal. Oropharynx is clear and moist EYES: Conjunctivae and EOM are normal. Pupils are equal, round, and reactive to light. No scleral icterus.  NECK: Normal range of motion, supple, no masses SKIN: Skin is warm and dry. No  rash noted. Not diaphoretic. No erythema. No pallor. NEUROLOGIC: Alert and oriented to person, place, and time. Normal reflexes, muscle tone coordination. No cranial nerve deficit noted. PSYCHIATRIC: Normal mood and affect. Normal behavior. Normal judgment and thought content. CARDIOVASCULAR: Normal heart rate noted, regular rhythm RESPIRATORY: Effort and breath sounds normal, no problems with respiration noted ABDOMEN: Soft, nontender, nondistended, gravid. MUSCULOSKELETAL: Normal range of motion. No edema and no tenderness. 2+ distal pulses.  Cervical Exam: Dilatation 6cm   Effacement 80%   Station -1   Presentation: cephalic FHT:  Baseline rate 140 bpm   Variability moderate  Accelerations present   Decelerations none Contractions: Irregular, difficult to monitor with toco, however possibly 1-3 mins   Pertinent Labs/Studies:   Results for orders placed or performed during the hospital encounter of 09/04/20 (from the past 24 hour(s))  CBC     Status: Abnormal   Collection Time: 09/04/20  4:06 AM  Result Value Ref Range   WBC 16.4 (H) 4.0 - 10.5 K/uL   RBC 4.39 3.87 - 5.11 MIL/uL   Hemoglobin 11.9 (L) 12.0 - 15.0 g/dL   HCT 09/06/20 64.3 - 32.9 %   MCV 82.9 80.0 - 100.0 fL   MCH 27.1 26.0 - 34.0 pg   MCHC 32.7 30.0 - 36.0 g/dL   RDW 51.8 84.1 - 66.0 %   Platelets 435 (H) 150 - 400 K/uL   nRBC 0.0 0.0 - 0.2 %  Type and screen [facility]     Status: None   Collection Time: 09/04/20  4:06 AM  Result Value Ref Range   ABO/RH(D) O NEG    Antibody Screen POS    Sample Expiration 09/07/2020,2359    Antibody Identification      PASSIVELY ACQUIRED ANTI-D Performed at [facility], [address] [place], [place]   Resp Panel by RT-PCR (Flu A&B, Covid) Nasopharyngeal Swab     Status: None   Collection Time: 09/04/20  4:06 AM   Specimen: Nasopharyngeal Swab; Nasopharyngeal(NP) swabs in vial transport medium  Result Value Ref Range   SARS  Coronavirus 2 by RT PCR NEGATIVE NEGATIVE   Influenza A by PCR NEGATIVE NEGATIVE   Influenza B by PCR NEGATIVE NEGATIVE    Assessment : Jaime Mcintosh is a 30 y.o. G2P1001 at [redacted]w[redacted]d being admitted for labor. Rh neg.   Plan: Labor: Expectant management.Augmentation as ordered as per protocol. Analgesia as needed. FWB: Reassuring fetal heart tracing.  GBS negative Delivery plan: Hopeful for vaginal delivery. For Rhogam postpartum as indicated.     [redacted]w[redacted]d, MD [facility]

## 2020-09-04 NOTE — Anesthesia Procedure Notes (Signed)
Epidural Patient location during procedure: OB Start time: 09/04/2020 5:01 AM End time: 09/04/2020 5:22 AM  Staffing Anesthesiologist: Corinda Gubler, MD Performed: anesthesiologist   Preanesthetic Checklist Completed: patient identified, IV checked, site marked, risks and benefits discussed, surgical consent, monitors and equipment checked, pre-op evaluation and timeout performed  Epidural Patient position: sitting Prep: ChloraPrep Patient monitoring: heart rate, continuous pulse ox and blood pressure Approach: midline Location: L3-L4 Injection technique: LOR saline  Needle:  Needle type: Tuohy  Needle gauge: 17 G Needle length: 9 cm and 9 Needle insertion depth: 6 cm Catheter type: closed end flexible Catheter size: 19 Gauge Catheter at skin depth: 12 cm Test dose: negative and 1.5% lidocaine with Epi 1:200 K  Assessment Sensory level: T10 Events: blood not aspirated, injection not painful, no injection resistance, no paresthesia and negative IV test  Additional Notes first attempt Pt. Evaluated and documentation done after procedure finished. Patient identified. Risks/Benefits/Options discussed with patient including but not limited to bleeding, infection, nerve damage, paralysis, failed block, incomplete pain control, headache, blood pressure changes, nausea, vomiting, reactions to medication both or allergic, itching and postpartum back pain. Confirmed with bedside nurse the patient's most recent platelet count. Confirmed with patient that they are not currently taking any anticoagulation, have any bleeding history or any family history of bleeding disorders. Patient expressed understanding and wished to proceed. All questions were answered. Sterile technique was used throughout the entire procedure. Please see nursing notes for vital signs. Test dose was given through epidural catheter and negative prior to continuing to dose epidural or start infusion. Warning signs of high  block given to the patient including shortness of breath, tingling/numbness in hands, complete motor block, or any concerning symptoms with instructions to call for help. Patient was given instructions on fall risk and not to get out of bed. All questions and concerns addressed with instructions to call with any issues or inadequate analgesia.   Patient tolerated the insertion well without immediate complications.Reason for block:procedure for pain

## 2020-09-04 NOTE — Progress Notes (Signed)
Intrapartum Progress Note  S: Patient feeling more pressure.   O: Blood pressure 108/65, pulse 78, temperature 97.7 F (36.5 C), temperature source Oral, resp. rate 20, height 5\' 4"  (1.626 m), last menstrual period 12/01/2019, SpO2 99 %. Gen App: NAD, comfortable Abdomen: soft, gravid FHT: baseline 140 bpm.  Accels present.  Decels present - rare shallow variable. moderate in degree variability.   Tocometer: contractions q 2-3 minutes Cervix: 9/80-90/0. No amniotic sac palpable. Extremities: Nontender, no edema.  Pitocin: None  Labs: No new labs   Assessment:  1: SIUP at [redacted]w[redacted]d 2. Rh negative status 3. History of macrosomia  Plan:  1. Continue expectant management. Anticipate vaginal delivery soon 2. Fetus feels appropriate gestational size.  3. Rhogam postpartum if indicated.    [redacted]w[redacted]d, MD 09/04/2020 10:27 AM

## 2020-09-04 NOTE — Anesthesia Preprocedure Evaluation (Signed)
Anesthesia Evaluation  Patient identified by MRN, date of birth, ID band Patient awake    Reviewed: Allergy & Precautions, NPO status , Patient's Chart, lab work & pertinent test results  History of Anesthesia Complications Negative for: history of anesthetic complications  Airway Mallampati: III  TM Distance: >3 FB Neck ROM: Full    Dental no notable dental hx. (+) Teeth Intact   Pulmonary neg pulmonary ROS, neg sleep apnea, neg COPD, Patient abstained from smoking.Not current smoker,    Pulmonary exam normal breath sounds clear to auscultation       Cardiovascular Exercise Tolerance: Good METS(-) hypertension(-) CAD and (-) Past MI negative cardio ROS  (-) dysrhythmias  Rhythm:Regular Rate:Normal - Systolic murmurs    Neuro/Psych  Headaches, negative psych ROS   GI/Hepatic neg GERD  ,(+)     (-) substance abuse  ,   Endo/Other  neg diabetes  Renal/GU negative Renal ROS     Musculoskeletal   Abdominal   Peds  Hematology   Anesthesia Other Findings Past Medical History: No date: Headache     Comment:  hx migraines 06/2016: History of pre-eclampsia     Comment:  mild  Reproductive/Obstetrics (+) Pregnancy                             Anesthesia Physical Anesthesia Plan  ASA: II  Anesthesia Plan: Epidural   Post-op Pain Management:    Induction:   PONV Risk Score and Plan: 2 and Treatment may vary due to age or medical condition and Ondansetron  Airway Management Planned: Natural Airway  Additional Equipment:   Intra-op Plan:   Post-operative Plan:   Informed Consent: I have reviewed the patients History and Physical, chart, labs and discussed the procedure including the risks, benefits and alternatives for the proposed anesthesia with the patient or authorized representative who has indicated his/her understanding and acceptance.       Plan Discussed with:  Surgeon  Anesthesia Plan Comments: (Discussed R/B/A of neuraxial anesthesia technique with patient: - rare risks of spinal/epidural hematoma, nerve damage, infection - Risk of PDPH - Risk of itching - Risk of nausea and vomiting - Risk of poor block necessitating replacement of epidural. Patient voiced understanding.)        Anesthesia Quick Evaluation

## 2020-09-05 LAB — CBC
HCT: 30.4 % — ABNORMAL LOW (ref 36.0–46.0)
Hemoglobin: 9.9 g/dL — ABNORMAL LOW (ref 12.0–15.0)
MCH: 27.1 pg (ref 26.0–34.0)
MCHC: 32.6 g/dL (ref 30.0–36.0)
MCV: 83.3 fL (ref 80.0–100.0)
Platelets: 346 10*3/uL (ref 150–400)
RBC: 3.65 MIL/uL — ABNORMAL LOW (ref 3.87–5.11)
RDW: 14.2 % (ref 11.5–15.5)
WBC: 14 10*3/uL — ABNORMAL HIGH (ref 4.0–10.5)
nRBC: 0 % (ref 0.0–0.2)

## 2020-09-05 LAB — FETAL SCREEN: Fetal Screen: NEGATIVE

## 2020-09-05 MED ORDER — IBUPROFEN 800 MG PO TABS: 800.0000 mg | ORAL_TABLET | Freq: Three times a day (TID) | ORAL | 1 refills | Status: DC | PRN

## 2020-09-05 MED ORDER — RHO D IMMUNE GLOBULIN 1500 UNIT/2ML IJ SOSY
300.0000 ug | PREFILLED_SYRINGE | Freq: Once | INTRAMUSCULAR | Status: AC
  Administered 2020-09-05: 300 ug via INTRAVENOUS
  Filled 2020-09-05: qty 2

## 2020-09-05 NOTE — Progress Notes (Addendum)
Post Partum Day # 1, s/p SVD  Subjective: no complaints, up ad lib, voiding and tolerating PO.  Objective: Temp:  [97.9 F (36.6 C)-100.4 F (38 C)] 98.2 F (36.8 C) (05/29 0737) Pulse Rate:  [64-105] 64 (05/29 0737) Resp:  [14-20] 20 (05/29 0737) BP: (99-118)/(60-80) 106/65 (05/29 0737) SpO2:  [96 %-100 %] 98 % (05/29 0737)  Physical Exam:  General: alert, cooperative and no distress  Lungs: clear to auscultation bilaterally Breasts: normal appearance, no masses or tenderness Heart: regular rate and rhythm, S1, S2 normal, no murmur, click, rub or gallop Abdomen: soft, non-tender; bowel sounds normal; no masses,  no organomegaly Pelvis: Lochia: appropriate, Uterine Fundus: firm Extremities: DVT Evaluation: No evidence of DVT seen on physical exam. Negative Homan's sign. No cords or calf tenderness. No significant calf/ankle edema.  Recent Labs    09/04/20 0406 09/05/20 0354  HGB 11.9* 9.9*  HCT 36.4 30.4*    Assessment/Plan: Doing well postpartum Breastfeeding, Lactation consult  Contraception: undecided.  Mild anemia of pregnancy, asymptomatic.  Rh negative, for Rhogam Can d/c home today as desired or in a.m.    LOS: 1 day   Hildred Laser, MD [facility] 09/05/2020 8:50 AM

## 2020-09-05 NOTE — Discharge Summary (Addendum)
Postpartum Discharge Summary     Patient Name: Jaime Mcintosh DOB: [birth year] MRN: [id number]  Date of admission: 09/04/2020 Delivery date:09/04/2020  Delivering provider: Rubie Maid  Date of discharge: 09/05/2020  Admitting diagnosis: Pregnancy [Z34.90] Post-dates pregnancy [O48.0] Intrauterine pregnancy: [redacted]w[redacted]d    Secondary diagnosis:  Active Problems:   Pregnancy   Post-dates pregnancy  Additional problems: History of macrosomia, history of pre-eclampsia in prior pregnancy    Discharge diagnosis: Term Pregnancy Delivered                                              Post partum procedures:rhogam Augmentation: None Complications: None  Hospital course: Onset of Labor With Vaginal Delivery      30y.o. yo [id number] admitted in Latent Labor on 09/04/2020. Patient had an uncomplicated labor course as follows:  Membrane Rupture Time/Date:  ,   Delivery Method:Vaginal, Spontaneous  Episiotomy: None  Lacerations:  None  Patient had an uncomplicated postpartum course.  She is ambulating, tolerating a regular diet, passing flatus, and urinating well. Patient is discharged home in stable condition on 09/05/20.  Newborn Data: Birth date:09/04/2020  Birth time:11:57 AM  Gender:Female  Living status:Living  Apgars:7 ,9  Weight:3330 g   Magnesium Sulfate received: No BMZ received: No Rhophylac:Yes MMR:No T-DaP:Given prenatally Flu: No Transfusion:No  Physical exam  Vitals:   09/04/20 1909 09/04/20 2313 09/05/20 0359 09/05/20 0737  BP: 117/74 107/67 102/67 106/65  Pulse: 89 79 76 64  Resp: _0 Temp: 97.9 F (36.6 C) 98.6 F (37 C) 97.9 F (36.6 C) 98.2 F (36.8 C)  TempSrc: Oral Oral Oral Oral  SpO2: 99% 100% 96% 98%  Height:       General: alert, cooperative and no distress Lochia: appropriate Uterine Fundus: firm Incision: N/A DVT Evaluation: No evidence of DVT seen on physical exam. Negative Homan's sign. No cords or calf  tenderness. No significant calf/ankle edema.   Labs: Lab Results  Component Value Date   WBC 14.0 (H) 09/05/2020   HGB 9.9 (L) 09/05/2020   HCT 30.4 (L) 09/05/2020   MCV 83.3 09/05/2020   PLT 346 09/05/2020   CMP Latest Ref Rng & Units 03/11/2019  Glucose 65 - 99 mg/dL 83  BUN 6 - 20 mg/dL 12  Creatinine 0.57 - 1.00 mg/dL 0.80  Sodium 134 - 144 mmol/L 141  Potassium 3.5 - 5.2 mmol/L 4.4  Chloride 96 - 106 mmol/L 104  CO2 20 - 29 mmol/L 24  Calcium 8.7 - 10.2 mg/dL 9.0  Total Protein 6.0 - 8.5 g/dL 6.6  Total Bilirubin 0.0 - 1.2 mg/dL 0.9  Alkaline Phos 39 - 117 IU/L 70  AST 0 - 40 IU/L 9  ALT 0 - 32 IU/L 8   Edinburgh Score: Edinburgh Postnatal Depression Scale Screening Tool 09/04/2020  I have been able to laugh and see the funny side of things. (No Data)      After visit meds:  Allergies as of 09/05/2020   No Known Allergies     Medication List    STOP taking these medications   aspirin EC 81 MG tablet     TAKE these medications   ibuprofen 800 MG tablet Commonly known as: ADVIL Take 1 tablet (800 mg total) by mouth every 8 (eight) hours as needed.   prenatal vitamin  w/FE, FA 29-1 MG Chew chewable tablet Chew 1 tablet by mouth daily at 12 noon.        Discharge home in stable condition Infant Feeding: Breast Infant Disposition:home with mother Discharge instruction: per After Visit Summary and Postpartum booklet. Activity: Advance as tolerated. Pelvic rest for 6 weeks.  Diet: routine diet Anticipated Birth Control: Unsure Postpartum Appointment:6 weeks Additional Postpartum F/U: Postpartum Depression checkup Future Appointments:No future appointments. Follow up Visit:  Follow-up Information    Rubie Maid, MD Follow up.   Specialties: Obstetrics and Gynecology, Radiology Why: 2 week televisit  6 week postpartum visit Contact information: [place] [address] [phone]                    09/05/2020 Rubie Maid, MD

## 2020-09-05 NOTE — Discharge Instructions (Signed)
Discharge Instructions:   Follow-up Appointment: Call and schedule a 2-week televisit and a 6-week postpartum appointment with Dr. Valentino Saxon at [facility]!   If there are any new medications, they have been ordered and will be available for pickup at the listed pharmacy on your way home from the hospital.   Call office if you have any of the following: headache, visual changes, fever >100.4 F, chills, shortness of breath, breast concerns, excessive vaginal bleeding, incision drainage or problems, leg pain or redness, depression or any other concerns. If you have vaginal discharge with an odor, let your doctor know.   It is normal to bleed for up to 6 weeks. You should not soak through more than 1 pad in 1 hour. If you have a blood clot larger than your fist with continued bleeding, call your doctor.   Activity: Do not lift > 10 lbs for 6 weeks (do not lift anything heavier than your baby). No intercourse, tampons, swimming pools, hot tubs, baths (only showers) for 6 weeks.  No driving for 1-2 weeks. Continue prenatal vitamin, especially if breastfeeding. Increase calories and fluids (water) while breastfeeding.   Your milk will come in, in the next couple of days (right now it is colostrum). You may have a slight fever when your milk comes in, but it should go away on its own.  If it does not, and rises above 101 F please call the doctor. You will also feel achy and your breasts will be firm. They will also start to leak. If you are breastfeeding, continue as you have been and you can pump/express milk for comfort.   If you have too much milk, your breasts can become engorged, which could lead to mastitis. This is an infection of the milk ducts. It can be very painful and you will need to notify your doctor to obtain a prescription for antibiotics. You can also treat it with a shower or hot/cold compress.   For concerns about your baby, please call your pediatrician.  For breastfeeding  concerns, the lactation consultant can be reached at [phone].   Postpartum blues (feelings of happy one minute and sad another minute) are normal for the first few weeks but if it gets worse let your doctor know.   Congratulations! We enjoyed caring for you and your new bundle of joy!

## 2020-09-05 NOTE — Progress Notes (Signed)
Patient discharged home with infant. FOB present at discharge. Discharge instructions and prescriptions given and reviewed with patient. Patient verbalized understanding.   Encouraged patient to call and schedule a 2-week televisit and a 6-week postpartum visit with Dr. Valentino Saxon at [facility].   Escorted out by volunteers.

## 2020-09-05 NOTE — Anesthesia Postprocedure Evaluation (Signed)
Anesthesia Post Note  Patient: Jaime Mcintosh  Procedure(s) Performed: AN AD HOC LABOR EPIDURAL  Patient location during evaluation: Mother Baby Anesthesia Type: Epidural Level of consciousness: awake and alert Pain management: pain level controlled Vital Signs Assessment: post-procedure vital signs reviewed and stable Respiratory status: spontaneous breathing, nonlabored ventilation and respiratory function stable Cardiovascular status: stable Postop Assessment: no headache, no backache, patient able to bend at knees and able to ambulate Anesthetic complications: no   No complications documented.   Last Vitals:  Vitals:   09/05/20 0359 09/05/20 0737  BP: 102/67 106/65  Pulse: 76 64  Resp: 18 20  Temp: 36.6 C 36.8 C  SpO2: 96% 98%    Last Pain:  Vitals:   09/05/20 0737  TempSrc: Oral  PainSc:                  Cleda Mccreedy Adamaris King

## 2020-09-06 ENCOUNTER — Telehealth: Payer: Self-pay

## 2020-09-06 LAB — RHOGAM INJECTION: Unit division: 0

## 2020-09-06 NOTE — Telephone Encounter (Signed)
Transition Care Management Follow-up Telephone Call  Date of discharge and from where: 09/05/2020 from [facility]  How have you been since you were released from the hospital? Pt states that she is doing well and has no questions or concerns.   Any questions or concerns? No  Items Reviewed:  Did the pt receive and understand the discharge instructions provided? No   Medications obtained and verified? Yes   Other? No   Any new allergies since your discharge? No   Dietary orders reviewed? n/a  Do you have support at home? Yes   Functional Questionnaire: (I = Independent and D = Dependent) ADLs: I  Bathing/Dressing- I  Meal Prep- I  Eating- I  Maintaining continence- I  Transferring/Ambulation- I  Managing Meds- I   Follow up appointments reviewed:   PCP Hospital f/u appt confirmed? No    Specialist Hospital f/u appt confirmed? No  Pt will call tomorrow to schedule 2 week follow up.   Are transportation arrangements needed? No   If their condition worsens, is the pt aware to call PCP or go to the Emergency Dept.? Yes  Was the patient provided with contact information for the PCP's office or ED? Yes  Was to pt encouraged to call back with questions or concerns? Yes

## 2020-10-12 ENCOUNTER — Other Ambulatory Visit: Payer: Self-pay

## 2020-10-12 ENCOUNTER — Ambulatory Visit (INDEPENDENT_AMBULATORY_CARE_PROVIDER_SITE_OTHER): Payer: BC Managed Care – PPO | Admitting: Obstetrics and Gynecology

## 2020-10-12 ENCOUNTER — Encounter: Payer: Self-pay | Admitting: Obstetrics and Gynecology

## 2020-10-12 ENCOUNTER — Encounter: Payer: BC Managed Care – PPO | Admitting: Obstetrics and Gynecology

## 2020-10-12 DIAGNOSIS — O9081 Anemia of the puerperium: Secondary | ICD-10-CM

## 2020-10-12 DIAGNOSIS — Z3009 Encounter for other general counseling and advice on contraception: Secondary | ICD-10-CM | POA: Diagnosis not present

## 2020-10-12 LAB — POCT URINE PREGNANCY: Preg Test, Ur: NEGATIVE

## 2020-10-12 MED ORDER — MEDROXYPROGESTERONE ACETATE 150 MG/ML IM SUSP
150.0000 mg | Freq: Once | INTRAMUSCULAR | Status: AC
  Administered 2020-10-12: 150 mg via INTRAMUSCULAR

## 2020-10-12 MED ORDER — MEDROXYPROGESTERONE ACETATE 150 MG/ML IM SUSP: 150.0000 mg | INTRAMUSCULAR | 3 refills | Status: DC

## 2020-10-12 NOTE — Patient Instructions (Signed)

## 2020-10-12 NOTE — Progress Notes (Signed)
   OBSTETRICS POSTPARTUM CLINIC PROGRESS NOTE  Subjective:     Jaime Mcintosh is a 30 y.o. G2P2002 female who presents for a postpartum visit. She is 6 weeks postpartum following a spontaneous vaginal delivery. I have fully reviewed the prenatal and intrapartum course. The delivery was at 39.[redacted] weeks gestational weeks.  Anesthesia: epidural. Postpartum course has been well. Baby's course has been well. Baby is feeding by breast. Bleeding: patient has/has not resumed menses, with No LMP recorded.. Bowel function is normal. Bladder function is normal. Patient is sexually active (used withdrawal methods). Contraception method desired is  unsure . Postpartum depression screening: negative.  Edinburgh Postnatal Depression Scale Screening Tool 10/12/2020 09/05/2020 09/04/2020  I have been able to laugh and see the funny side of things. 0 0 (No Data)  I have looked forward with enjoyment to things. 0 0 -  I have blamed myself unnecessarily when things went wrong. 0 1 -  I have been anxious or worried for no good reason. 0 2 -  I have felt scared or panicky for no good reason. 0 1 -  Things have been getting on top of me. 0 0 -  I have been so unhappy that I have had difficulty sleeping. 0 0 -  I have felt sad or miserable. 0 1 -  I have been so unhappy that I have been crying. 0 0 -  The thought of harming myself has occurred to me. 0 0 -  Edinburgh Postnatal Depression Scale Total 0 5 -      The following portions of the patient's history were reviewed and updated as appropriate: allergies, current medications, past family history, past medical history, past social history, past surgical history, and problem list.  Review of Systems Pertinent items noted in HPI and remainder of comprehensive ROS otherwise negative.   Objective:    BP 118/75   Pulse 72   Ht 5\' 4"  (1.626 m)   Wt 196 lb 9.6 oz (89.2 kg)   Breastfeeding Yes   BMI 33.75 kg/m   General:  alert and no distress   Breasts:   inspection negative, no nipple discharge or bleeding, no masses or nodularity palpable  Lungs: clear to auscultation bilaterally  Heart:  regular rate and rhythm, S1, S2 normal, no murmur, click, rub or gallop  Abdomen: soft, non-tender; bowel sounds normal; no masses,  no organomegaly.     Vulva:  normal  Vagina: normal vagina, no discharge, exudate, lesion, or erythema  Cervix:  no cervical motion tenderness and no lesions  Corpus: normal size, contour, position, consistency, mobility, non-tender  Adnexa:  normal adnexa and no mass, fullness, tenderness  Rectal Exam: Not performed.         Labs:  Lab Results  Component Value Date   HGB 9.9 (L) 09/05/2020    Assessment:   1. Postpartum care following vaginal delivery  2. Birth control counseling  3. Anemia of pregnancy  Plan:    1. Contraception: discussed all methods of contraception, after discussion, desires to start Depo Provera.  UPT negative today.  Given first injection.  2. Mild anemia postpartum, asymptomatic. Using iron in PNV. 3. Follow up in: 3 months for Depo Provera injection.  Follow u pin 6 months for annual exam.   09/07/2020, MD [facility]

## 2020-10-12 NOTE — Progress Notes (Signed)
  PT is present today for her postpartum visit. Pt stated that she is breastfeeding and have had sexually intercourse recently without protection. Pt stated that she was unsure about which form of birth control. UPT- NEG. EPDS= 0.  Pt stated that she is doing well no complaints.

## 2020-10-12 NOTE — Progress Notes (Signed)
Pt present for postpartum visit. Pt stated that she was breastfeeding and stated no issues. Pt has had sexually intercourse recently without any protection. Pt would like to discuss birth control but unsure of what form. UPT-NEG. EPDS=NEG.

## 2021-01-06 ENCOUNTER — Telehealth: Payer: Self-pay

## 2021-01-06 NOTE — Telephone Encounter (Signed)
Called pt to see if she wanted to come in earlier  for her depo injection. Pt stated that she could come in on tomorrow instead of Monday.

## 2021-01-06 NOTE — Progress Notes (Signed)
Date last pap: 03/11/2019. Last Depo-Provera: 10/12/2020. Side Effects if any: None. Serum HCG indicated? Not Applicable. Depo-Provera 150 mg IM given by: Santiago Bumpers, CMA. Next appointment due: Dec 16 - Dec. 30

## 2021-01-07 ENCOUNTER — Other Ambulatory Visit: Payer: Self-pay

## 2021-01-07 ENCOUNTER — Ambulatory Visit (INDEPENDENT_AMBULATORY_CARE_PROVIDER_SITE_OTHER): Payer: BC Managed Care – PPO | Admitting: Obstetrics and Gynecology

## 2021-01-07 DIAGNOSIS — Z3042 Encounter for surveillance of injectable contraceptive: Secondary | ICD-10-CM | POA: Diagnosis not present

## 2021-01-07 MED ORDER — MEDROXYPROGESTERONE ACETATE 150 MG/ML IM SUSP
150.0000 mg | INTRAMUSCULAR | Status: DC
  Administered 2021-01-07: 150 mg via INTRAMUSCULAR

## 2021-01-10 ENCOUNTER — Ambulatory Visit: Payer: BC Managed Care – PPO

## 2021-01-12 ENCOUNTER — Ambulatory Visit: Payer: BC Managed Care – PPO

## 2021-02-09 ENCOUNTER — Encounter: Payer: Self-pay | Admitting: Family Medicine

## 2021-02-09 ENCOUNTER — Other Ambulatory Visit: Payer: Self-pay

## 2021-02-09 ENCOUNTER — Ambulatory Visit: Payer: BC Managed Care – PPO | Admitting: Family Medicine

## 2021-02-09 VITALS — BP 122/82 | HR 97 | Temp 98.7°F | Ht 64.0 in | Wt 201.0 lb

## 2021-02-09 DIAGNOSIS — R21 Rash and other nonspecific skin eruption: Secondary | ICD-10-CM

## 2021-02-09 DIAGNOSIS — L719 Rosacea, unspecified: Secondary | ICD-10-CM

## 2021-02-09 DIAGNOSIS — E559 Vitamin D deficiency, unspecified: Secondary | ICD-10-CM

## 2021-02-09 DIAGNOSIS — E669 Obesity, unspecified: Secondary | ICD-10-CM | POA: Diagnosis not present

## 2021-02-09 DIAGNOSIS — Z13220 Encounter for screening for lipoid disorders: Secondary | ICD-10-CM | POA: Diagnosis not present

## 2021-02-09 DIAGNOSIS — R519 Headache, unspecified: Secondary | ICD-10-CM | POA: Diagnosis not present

## 2021-02-09 DIAGNOSIS — G8929 Other chronic pain: Secondary | ICD-10-CM

## 2021-02-09 MED ORDER — DICLOFENAC SODIUM 50 MG PO TBEC: 50.0000 mg | DELAYED_RELEASE_TABLET | Freq: Two times a day (BID) | ORAL | 0 refills | Status: DC | PRN

## 2021-02-09 MED ORDER — CYCLOBENZAPRINE HCL 5 MG PO TABS: 5.0000 mg | ORAL_TABLET | Freq: Every evening | ORAL | 0 refills | Status: DC | PRN

## 2021-02-09 NOTE — Assessment & Plan Note (Signed)
Patient with stated concern over increased weight since having shoulder, does cite decreased activity, change in eating pattern, but does try to make healthy choices when possible.  I have advised calorie counting, information on dietary and exercise lifestyle modifications provided, and risk stratification labs ordered.  We will have her follow-up to review this information.

## 2021-02-09 NOTE — Progress Notes (Signed)
Primary Care / Sports Medicine Office Visit  Patient Information:  Patient ID: Jaime Mcintosh, female DOB: 06-01-1990 Age: 30 y.o. MRN: [id number]   Jaime Mcintosh is a pleasant 30 y.o. female presenting with the following:  Chief Complaint  Patient presents with   New Patient (Initial Visit)   Establish Care   Headache    Started as a teenager and saw a neurologist with no known cause; left-sided temporal to occipital; intermittent; takes Excedrin with some relief, but only sleeping and time helps eliminate them   Rash    Started around 2017 during first pregnancy on bilateral cheeks; has never seen a dermatologist    Review of Systems pertinent details above   Patient Active Problem List   Diagnosis Date Noted   Rosacea 02/09/2021   Chronic intractable headache 02/09/2021   Cystic fibrosis carrier 04/14/2020   History of migraine 01/07/2016   Obesity (BMI 30.0-34.9) 01/07/2016   Past Medical History:  Diagnosis Date   Headache    hx migraines   History of pre-eclampsia 06/2016   mild   Outpatient Encounter Medications as of 02/09/2021  Medication Sig   aspirin-acetaminophen-caffeine (EXCEDRIN MIGRAINE) 250-250-65 MG tablet Take by mouth every 6 (six) hours as needed for headache.   cyclobenzaprine (FLEXERIL) 5 MG tablet Take 1 tablet (5 mg total) by mouth at bedtime as needed (head/neck tightness).   diclofenac (VOLTAREN) 50 MG EC tablet Take 1 tablet (50 mg total) by mouth 2 (two) times daily as needed (headache).   medroxyPROGESTERone (DEPO-PROVERA) 150 MG/ML injection Inject 1 mL (150 mg total) into the muscle every 3 (three) months.   [DISCONTINUED] ibuprofen (ADVIL) 800 MG tablet Take 1 tablet (800 mg total) by mouth every 8 (eight) hours as needed.   [DISCONTINUED] prenatal vitamin w/FE, FA (NATACHEW) 29-1 MG CHEW chewable tablet Chew 1 tablet by mouth daily at 12 noon.   [DISCONTINUED] medroxyPROGESTERone (DEPO-PROVERA) injection 150 mg    No  facility-administered encounter medications on file as of 02/09/2021.   History reviewed. No pertinent surgical history.  Vitals:   02/09/21 0800  BP: 122/82  Pulse: 97  Temp: 98.7 F (37.1 C)  SpO2: 97%   Vitals:   02/09/21 0800  Weight: 201 lb (91.2 kg)  Height: 5\' 4"  (1.626 m)   Body mass index is 34.5 kg/m.  No results found.   Independent interpretation of notes and tests performed by another provider:   None  Procedures performed:   None  Pertinent History, Exam, Impression, and Recommendations:   Rosacea Patient describes few year history of facial rash on both cheeks, forehead, x10.  She is only treating this with concealer/make-up.  Her exam today reveals acneiform rash in the distribution mentioned.  A photo was reviewed which shows findings most consistent with rosacea.  I will order restratification labs to rule out autoimmune condition given overlap with malar rash.  I have also placed a referral to dermatology for further evaluation and management as well.  Chronic intractable headache Patient with longstanding (greater than 10 years) history of primarily left-sided temporal headache with involvement to the posterior cervical spine and radiation to the right paraspinal and temporal regions.  These occur roughly twice per month, in the past (high school) these were nearly daily.  She did see cardiology and neurology, advised daily prophylactic medication which she is unsure of but cites limited utility.  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  Denies overt incapacitation from these episodes, no neurologic features regularly but did note 1 episode of "floaters"with her left eye vision.  She does have upcoming optometry appointment.  Physical examination from a grocery logical standpoint is normal, cardiopulmonary findings are benign, there is a kyphotic posture at the cervical  spine.  Headache pattern primary concern for tension type headache, migraine headache to be considered as well.  Risk stratification labs and x-ray of the cervical spine ordered.  I have written for as needed diclofenac and cyclobenzaprine, after follow-up for reevaluation.  Obesity (BMI 30.0-34.9) Patient with stated concern over increased weight since having shoulder, does cite decreased activity, change in eating pattern, but does try to make healthy choices when possible.  I have advised calorie counting, information on dietary and exercise lifestyle modifications provided, and risk stratification labs ordered.  We will have her follow-up to review this information.   Orders & Medications Meds ordered this encounter  Medications   diclofenac (VOLTAREN) 50 MG EC tablet    Sig: Take 1 tablet (50 mg total) by mouth 2 (two) times daily as needed (headache).    Dispense:  30 tablet    Refill:  0   cyclobenzaprine (FLEXERIL) 5 MG tablet    Sig: Take 1 tablet (5 mg total) by mouth at bedtime as needed (head/neck tightness).    Dispense:  14 tablet    Refill:  0   Orders Placed This Encounter  Procedures   DG Cervical Spine Complete   TSH Rfx on Abnormal to Free T4   CBC with Differential/Platelet   Comprehensive metabolic panel   VITAMIN D 25 Hydroxy (Vit-D Deficiency, Fractures)   Lipid panel   ANA 12 Plus Profile (RDL)   Ambulatory referral to Dermatology     Return in about 2 weeks (around 02/23/2021) for annual physical.     Montel Culver, MD   [place]

## 2021-02-09 NOTE — Assessment & Plan Note (Signed)
Patient describes few year history of facial rash on both cheeks, forehead, x10.  She is only treating this with concealer/make-up.  Her exam today reveals acneiform rash in the distribution mentioned.  A photo was reviewed which shows findings most consistent with rosacea.  I will order restratification labs to rule out autoimmune condition given overlap with malar rash.  I have also placed a referral to dermatology for further evaluation and management as well.

## 2021-02-09 NOTE — Assessment & Plan Note (Signed)
Patient with longstanding (greater than 10 years) history of primarily left-sided temporal headache with involvement to the posterior cervical spine and radiation to the right paraspinal and temporal regions.  These occur roughly twice per month, in the past (high school) these were nearly daily.  She did see cardiology and neurology, advised daily prophylactic medication which she is unsure of but cites limited utility.  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  Denies overt incapacitation from these episodes, no neurologic features regularly but did note 1 episode of "floaters"with her left eye vision.  She does have upcoming optometry appointment.  Physical examination from a grocery logical standpoint is normal, cardiopulmonary findings are benign, there is a kyphotic posture at the cervical spine.  Headache pattern primary concern for tension type headache, migraine headache to be considered as well.  Risk stratification labs and x-ray of the cervical spine ordered.  I have written for as needed diclofenac and cyclobenzaprine, after follow-up for reevaluation.

## 2021-02-09 NOTE — Patient Instructions (Signed)
-   Obtain x-rays - Obtain fasting labs - Start calorie counting - Can dose diclofenac up to twice a day on an as-needed basis for headache - Can dose nightly cyclobenzaprine on an as-needed basis for tightness - Return for follow-up in 2 weeks for physical - Contact us for any questions between now and then

## 2021-02-14 DIAGNOSIS — Z13220 Encounter for screening for lipoid disorders: Secondary | ICD-10-CM | POA: Diagnosis not present

## 2021-02-14 DIAGNOSIS — L719 Rosacea, unspecified: Secondary | ICD-10-CM | POA: Diagnosis not present

## 2021-02-14 DIAGNOSIS — E559 Vitamin D deficiency, unspecified: Secondary | ICD-10-CM | POA: Diagnosis not present

## 2021-02-14 DIAGNOSIS — E669 Obesity, unspecified: Secondary | ICD-10-CM | POA: Diagnosis not present

## 2021-02-23 LAB — CBC WITH DIFFERENTIAL/PLATELET
Basophils Absolute: 0.1 10*3/uL (ref 0.0–0.2)
Basos: 1 %
EOS (ABSOLUTE): 0.2 10*3/uL (ref 0.0–0.4)
Eos: 3 %
Hematocrit: 38.7 % (ref 34.0–46.6)
Hemoglobin: 13.1 g/dL (ref 11.1–15.9)
Immature Grans (Abs): 0 10*3/uL (ref 0.0–0.1)
Immature Granulocytes: 0 %
Lymphocytes Absolute: 2.5 10*3/uL (ref 0.7–3.1)
Lymphs: 33 %
MCH: 29 pg (ref 26.6–33.0)
MCHC: 33.9 g/dL (ref 31.5–35.7)
MCV: 86 fL (ref 79–97)
Monocytes Absolute: 0.5 10*3/uL (ref 0.1–0.9)
Monocytes: 6 %
Neutrophils Absolute: 4.4 10*3/uL (ref 1.4–7.0)
Neutrophils: 57 %
Platelets: 376 10*3/uL (ref 150–450)
RBC: 4.51 x10E6/uL (ref 3.77–5.28)
RDW: 12 % (ref 11.7–15.4)
WBC: 7.6 10*3/uL (ref 3.4–10.8)

## 2021-02-23 LAB — COMPREHENSIVE METABOLIC PANEL
ALT: 19 IU/L (ref 0–32)
AST: 16 IU/L (ref 0–40)
Albumin/Globulin Ratio: 1.9 (ref 1.2–2.2)
Albumin: 4.3 g/dL (ref 3.9–5.0)
Alkaline Phosphatase: 104 IU/L (ref 44–121)
BUN/Creatinine Ratio: 14 (ref 9–23)
BUN: 13 mg/dL (ref 6–20)
Bilirubin Total: 0.3 mg/dL (ref 0.0–1.2)
CO2: 20 mmol/L (ref 20–29)
Calcium: 9 mg/dL (ref 8.7–10.2)
Chloride: 107 mmol/L — ABNORMAL HIGH (ref 96–106)
Creatinine, Ser: 0.92 mg/dL (ref 0.57–1.00)
Globulin, Total: 2.3 g/dL (ref 1.5–4.5)
Glucose: 85 mg/dL (ref 70–99)
Potassium: 4.7 mmol/L (ref 3.5–5.2)
Sodium: 141 mmol/L (ref 134–144)
Total Protein: 6.6 g/dL (ref 6.0–8.5)
eGFR: 87 mL/min/{1.73_m2} (ref >59)

## 2021-02-23 LAB — ANA 12 PLUS PROFILE, POSITIVE
Anti-CCP Ab, IgG & IgA (RDL): <20 Units (ref <20)
Anti-Cardiolipin Ab, IgA (RDL): <12 APL U/mL (ref <12)
Anti-Cardiolipin Ab, IgG (RDL): <15 GPL U/mL (ref <15)
Anti-Cardiolipin Ab, IgM (RDL): <13 MPL U/mL (ref <13)
Anti-Centromere Ab (RDL): <1:40 {titer}
Anti-Chromatin Ab, IgG (RDL): <20 Units (ref <20)
Anti-La (SS-B) Ab (RDL): <20 Units (ref <20)
Anti-Ro (SS-A) Ab (RDL): <20 Units (ref <20)
Anti-Scl-70 Ab (RDL): <20 Units (ref <20)
Anti-Sm Ab (RDL): <20 Units (ref <20)
Anti-TPO Ab (RDL): <9 IU/mL (ref <9.0)
Anti-U1 RNP Ab (RDL): <20 Units (ref <20)
Anti-dsDNA Ab by Farr(RDL): <8 IU/mL (ref <8.0)
C3 Complement (RDL): 180 mg/dL — ABNORMAL HIGH (ref 82–167)
C4 Complement (RDL): 20 mg/dL (ref 14–44)
Homogeneous Pattern: 1:160 {titer} — ABNORMAL HIGH
Rheumatoid Factor by Turb RDL: <14 IU/mL (ref <14)
Speckled Pattern: 1:160 {titer} — ABNORMAL HIGH

## 2021-02-23 LAB — LIPID PANEL
Chol/HDL Ratio: 4.1 ratio (ref 0.0–4.4)
Cholesterol, Total: 155 mg/dL (ref 100–199)
HDL: 38 mg/dL — ABNORMAL LOW (ref >39)
LDL Chol Calc (NIH): 100 mg/dL — ABNORMAL HIGH (ref 0–99)
Triglycerides: 92 mg/dL (ref 0–149)
VLDL Cholesterol Cal: 17 mg/dL (ref 5–40)

## 2021-02-23 LAB — ANA 12 PLUS PROFILE (RDL): Anti-Nuclear Ab by IFA (RDL): POSITIVE — AB

## 2021-02-23 LAB — VITAMIN D 25 HYDROXY (VIT D DEFICIENCY, FRACTURES): Vit D, 25-Hydroxy: 34.3 ng/mL (ref 30.0–100.0)

## 2021-02-23 LAB — TSH RFX ON ABNORMAL TO FREE T4: TSH: 2.1 u[IU]/mL (ref 0.450–4.500)

## 2021-02-25 ENCOUNTER — Other Ambulatory Visit: Payer: Self-pay

## 2021-02-25 ENCOUNTER — Ambulatory Visit (INDEPENDENT_AMBULATORY_CARE_PROVIDER_SITE_OTHER): Payer: BC Managed Care – PPO | Admitting: Family Medicine

## 2021-02-25 ENCOUNTER — Encounter: Payer: Self-pay | Admitting: Family Medicine

## 2021-02-25 VITALS — BP 98/72 | HR 91 | Ht 64.0 in | Wt 200.0 lb

## 2021-02-25 DIAGNOSIS — G8929 Other chronic pain: Secondary | ICD-10-CM

## 2021-02-25 DIAGNOSIS — Z1159 Encounter for screening for other viral diseases: Secondary | ICD-10-CM

## 2021-02-25 DIAGNOSIS — R768 Other specified abnormal immunological findings in serum: Secondary | ICD-10-CM | POA: Insufficient documentation

## 2021-02-25 DIAGNOSIS — L719 Rosacea, unspecified: Secondary | ICD-10-CM

## 2021-02-25 DIAGNOSIS — Z0000 Encounter for general adult medical examination without abnormal findings: Secondary | ICD-10-CM | POA: Insufficient documentation

## 2021-02-25 DIAGNOSIS — R519 Headache, unspecified: Secondary | ICD-10-CM

## 2021-02-25 MED ORDER — DICLOFENAC SODIUM 50 MG PO TBEC: 50.0000 mg | DELAYED_RELEASE_TABLET | Freq: Two times a day (BID) | ORAL | 0 refills | Status: DC | PRN

## 2021-02-25 NOTE — Assessment & Plan Note (Signed)
Has upcoming evaluation by dermatology, we will follow peripherally.  She has had recent positive ANA and C3 complement noted.

## 2021-02-25 NOTE — Progress Notes (Signed)
Annual Physical Exam Visit  Patient Information:  Patient ID: Jaime Mcintosh, female DOB: [birth year] Age: 30 y.o. MRN: [id number]   Subjective:   CC: Annual Physical Exam  HPI:  Jaime Mcintosh is here for their annual physical.  I reviewed the past medical history, family history, social history, surgical history, and allergies today and changes were made as necessary.  Please see the problem list section below for additional details.  Past Medical History: Past Medical History:  Diagnosis Date   Headache    hx migraines   History of pre-eclampsia 06/2016   mild   Past Surgical History: No past surgical history on file. Family History: Family History  Problem Relation Age of Onset   Skin cancer Mother    Hyperlipidemia Father    Hypokalemia Sister    Arthritis Maternal Grandmother    Hypertension Maternal Grandmother    Varicose Veins Maternal Grandmother    Heart disease Maternal Grandfather    Heart attack Maternal Grandfather    Coronary artery disease Maternal Grandfather    Thyroid disease Paternal Grandmother    Cataracts Paternal Grandmother    Diabetes Paternal Grandfather    Stroke Neg Hx    Breast cancer Neg Hx    Allergies: No Known Allergies Health Maintenance: Health Maintenance  Topic Date Due   Hepatitis C Screening  Never done   COVID-19 Vaccine (1) 02/25/2021 (Originally 12/22/1990)   INFLUENZA VACCINE  07/08/2021 (Originally 11/08/2020)   PAP-Cervical Cytology Screening  03/10/2022   PAP SMEAR-Modifier  03/10/2022   TETANUS/TDAP  06/11/2030   HIV Screening  Completed   Pneumococcal Vaccine 33-68 Years old  Aged Out   HPV VACCINES  Aged Out    HM Colonoscopy     This patient has no relevant Health Maintenance data.      Medications: Current Outpatient Medications on File Prior to Visit  Medication Sig Dispense Refill   aspirin-acetaminophen-caffeine (EXCEDRIN MIGRAINE) 250-250-65 MG tablet Take by mouth every 6 (six) hours as  needed for headache.     cyclobenzaprine (FLEXERIL) 5 MG tablet Take 1 tablet (5 mg total) by mouth at bedtime as needed (head/neck tightness). 14 tablet 0   medroxyPROGESTERone (DEPO-PROVERA) 150 MG/ML injection Inject 1 mL (150 mg total) into the muscle every 3 (three) months. 1 mL 3   No current facility-administered medications on file prior to visit.    Review of Systems: +improved headache, visual changes, nausea, vomiting, diarrhea, constipation, dizziness, abdominal pain, +skin rash, fevers, chills, night sweats, swollen lymph nodes, weight loss, chest pain, body aches, joint swelling, muscle aches, shortness of breath, mood changes, visual or auditory hallucinations reported.  Objective:   Vitals:   02/25/21 1009  BP: 98/72  Pulse: 91  SpO2: 97%   Vitals:   02/25/21 1009  Weight: 200 lb (90.7 kg)  Height: 5\' 4"  (1.626 m)   Body mass index is 34.33 kg/m.  General: Well Developed, well nourished, and in no acute distress.  Neuro: Alert and oriented x3, extra-ocular muscles intact, sensation grossly intact. Cranial nerves II through XII are grossly intact, motor, sensory, and coordinative functions are intact. HEENT: Normocephalic, atraumatic, pupils equal round reactive to light, neck supple, no masses, no lymphadenopathy, thyroid nonpalpable. Oropharynx, nasopharynx, external ear canals are unremarkable. Skin: Warm and dry, faintly erythematous malar appearing rash noted. Scattered punctate and scattered cherry hemangiomas on back Cardiac: Regular rate and rhythm, no murmurs rubs or gallops. No peripheral edema. Pulses symmetric. Respiratory: Clear to auscultation  bilaterally. Not using accessory muscles, speaking in full sentences.  Abdominal: Soft, nontender, nondistended, positive bowel sounds, no masses, no organomegaly. Musculoskeletal: Shoulder, elbow, wrist, hip, knee, ankle stable, and with full range of motion.  Female chaperone initials: BN present throughout the  physical examination.  Impression and Recommendations:   The patient was counselled, risk factors were discussed, and anticipatory guidance given.  Rosacea Has upcoming evaluation by dermatology, we will follow peripherally.  She has had recent positive ANA and C3 complement noted.  Annual physical exam Annual examination completed, hep Cstratification labs ordered, anticipatory guidance provided.  We will follow labs once resulted.  Positive ANA (antinuclear antibody) Concern for malar rash with recent serum studies showing positive ANA and c3 complement. Referral to rheumatology placed.  Chronic intractable headache Improved with NSAIDs, supportive measures. Features most consistent with tension type headache pattern. Plan for home-based exercises, PRN diclofenac, and follow-up as-needed.   Orders & Medications Medications:  Meds ordered this encounter  Medications   diclofenac (VOLTAREN) 50 MG EC tablet    Sig: Take 1 tablet (50 mg total) by mouth 2 (two) times daily as needed (headache).    Dispense:  60 tablet    Refill:  0   Orders Placed This Encounter  Procedures   Hepatitis C antibody   Ambulatory referral to Rheumatology     Return in about 2 months (around 04/27/2021).    Jerrol Banana, MD   Primary Care Sports Medicine [facility]

## 2021-02-25 NOTE — Assessment & Plan Note (Signed)
Annual examination completed, hep Cstratification labs ordered, anticipatory guidance provided.  We will follow labs once resulted.

## 2021-02-25 NOTE — Assessment & Plan Note (Signed)
Improved with NSAIDs, supportive measures. Features most consistent with tension type headache pattern. Plan for home-based exercises, PRN diclofenac, and follow-up as-needed.

## 2021-02-25 NOTE — Assessment & Plan Note (Signed)
Concern for malar rash with recent serum studies showing positive ANA and c3 complement. Referral to rheumatology placed.

## 2021-02-25 NOTE — Patient Instructions (Addendum)
-   Obtain labs with orders provided  - Review information provided - Attend eye doctor annually, dentist every 6 months, work towards or maintain 30 minutes of moderate intensity physical activity at least 5 days per week, and consume a balanced diet - Maintain follow-up with dermatology, referral coordinator will contact you for follow-up with rheumatology - Continue home exercises and medications for headache/neck - Return in 2 months for follow-up - Contact us for any questions between now and then

## 2021-02-26 LAB — HEPATITIS C ANTIBODY: Hep C Virus Ab: 0.2 s/co ratio (ref 0.0–0.9)

## 2021-03-01 ENCOUNTER — Telehealth: Payer: Self-pay | Admitting: Obstetrics and Gynecology

## 2021-03-01 DIAGNOSIS — J09X2 Influenza due to identified novel influenza A virus with other respiratory manifestations: Secondary | ICD-10-CM | POA: Diagnosis not present

## 2021-03-01 DIAGNOSIS — Z20822 Contact with and (suspected) exposure to covid-19: Secondary | ICD-10-CM | POA: Diagnosis not present

## 2021-03-01 DIAGNOSIS — Z03818 Encounter for observation for suspected exposure to other biological agents ruled out: Secondary | ICD-10-CM | POA: Diagnosis not present

## 2021-03-01 DIAGNOSIS — Z20828 Contact with and (suspected) exposure to other viral communicable diseases: Secondary | ICD-10-CM | POA: Diagnosis not present

## 2021-03-01 NOTE — Telephone Encounter (Signed)
Pt aware Tamiflu is safe while breastfeeding. May effect breast milk supply.

## 2021-03-01 NOTE — Telephone Encounter (Signed)
Pt called asking if it is safe to use Tamaflu while breast feeding. I did not see on our sheet of safe medications. Please Advise.

## 2021-03-25 ENCOUNTER — Encounter: Payer: Self-pay | Admitting: Obstetrics and Gynecology

## 2021-03-25 ENCOUNTER — Other Ambulatory Visit: Payer: Self-pay

## 2021-03-29 ENCOUNTER — Ambulatory Visit: Payer: BC Managed Care – PPO

## 2021-03-30 DIAGNOSIS — L718 Other rosacea: Secondary | ICD-10-CM | POA: Diagnosis not present

## 2021-03-31 ENCOUNTER — Ambulatory Visit: Payer: BC Managed Care – PPO

## 2021-04-14 ENCOUNTER — Telehealth: Payer: Self-pay | Admitting: Obstetrics and Gynecology

## 2021-04-14 ENCOUNTER — Ambulatory Visit (INDEPENDENT_AMBULATORY_CARE_PROVIDER_SITE_OTHER): Payer: BC Managed Care – PPO | Admitting: Obstetrics and Gynecology

## 2021-04-14 ENCOUNTER — Other Ambulatory Visit: Payer: Self-pay

## 2021-04-14 ENCOUNTER — Encounter: Payer: Self-pay | Admitting: Obstetrics and Gynecology

## 2021-04-14 VITALS — BP 105/70 | HR 78 | Ht 65.0 in | Wt 196.1 lb

## 2021-04-14 DIAGNOSIS — Z01419 Encounter for gynecological examination (general) (routine) without abnormal findings: Secondary | ICD-10-CM | POA: Diagnosis not present

## 2021-04-14 DIAGNOSIS — Z3042 Encounter for surveillance of injectable contraceptive: Secondary | ICD-10-CM

## 2021-04-14 DIAGNOSIS — E669 Obesity, unspecified: Secondary | ICD-10-CM

## 2021-04-14 MED ORDER — DEPO-SUBQ PROVERA 104 104 MG/0.65ML ~~LOC~~ SUSY: 104.0000 mg | PREFILLED_SYRINGE | SUBCUTANEOUS | 3 refills | Status: DC

## 2021-04-14 NOTE — Progress Notes (Signed)
GYNECOLOGY ANNUAL PHYSICAL EXAM PROGRESS NOTE  Subjective:    Jaime Mcintosh is a 31 y.o. G62P1001 female who presents for an annual exam. She has no complaints today. She is sexually active.  The patient wears seatbelts: yes. The patient participates in regular exercise: no. Has the patient ever been transfused or tattooed?: yes (professionally done). Ebony Hail reports that there is not domestic violence in her life.   Notes that she was she recently established with a PCP within the past year.  Has had a rash on her face for several years, had some elevated auto-immune markers, is scheduled to see a Rheumatologist soon.   Gynecologic History  Menarche age:  45 No LMP recorded. Patient has had an injection. Contraception: Depo-Provera injections History of STI's: Denies Last Pap: 03/11/2019. Results were: normal.  Denies h/o abnormal pap smears.    OB History  Gravida Para Term Preterm AB Living  2 2 2  0 0 2  SAB IAB Ectopic Multiple Live Births  0 0 0 0 2    # Outcome Date GA Lbr Len/2nd Weight Sex Delivery Anes PTL Lv  2 Term 09/04/20 [redacted]w[redacted]d 09:37 / 00:05 7 lb 5.5 oz (3.33 kg) F Vag-Spont EPI  LIV     Name: Proto,GIRL Nancyann     Apgar1: 7  Apgar5: 9  1 Term 07/05/16 [redacted]w[redacted]d  9 lb 6.6 oz (4.27 kg) M Vag-Spont EPI  LIV     Name: Heaphy,BOY Rondalyn     Apgar1: 2  Apgar5: 7    Past Medical History:  Diagnosis Date   Headache    hx migraines   History of pre-eclampsia 06/2016   mild    Past Surgical History:  Procedure Laterality Date   NO PAST SURGERIES       Family History  Problem Relation Age of Onset   Skin cancer Mother    Hyperlipidemia Father    Hypokalemia Sister    Arthritis Maternal Grandmother    Hypertension Maternal Grandmother    Varicose Veins Maternal Grandmother    Heart disease Maternal Grandfather    Heart attack Maternal Grandfather    Coronary artery disease Maternal Grandfather    Thyroid disease Paternal Grandmother    Cataracts Paternal  Grandmother    Diabetes Paternal Grandfather    Stroke Neg Hx    Breast cancer Neg Hx     Social History   Socioeconomic History   Marital status: Divorced    Spouse name: Not on file   Number of children: 2   Years of education: 12+   Highest education level: Some college, no degree  Occupational History   Occupation: [employer]  Tobacco Use   Smoking status: Never   Smokeless tobacco: Never  Vaping Use   Vaping Use: Never used  Substance and Sexual Activity   Alcohol use: Not Currently   Drug use: Never   Sexual activity: Yes    Birth control/protection: Injection  Other Topics Concern   Not on file  Social History Narrative   Works nights   Social Determinants of Health   Financial Resource Strain: Not on file  Food Insecurity: Not on file  Transportation Needs: Not on file  Physical Activity: Not on file  Stress: Not on file  Social Connections: Not on file  Intimate Partner Violence: Not on file    Current Outpatient Medications on File Prior to Visit  Medication Sig Dispense Refill   aspirin-acetaminophen-caffeine (EXCEDRIN MIGRAINE) 250-250-65 MG tablet Take by mouth  every 6 (six) hours as needed for headache.     cyclobenzaprine (FLEXERIL) 5 MG tablet Take 1 tablet (5 mg total) by mouth at bedtime as needed (head/neck tightness). 14 tablet 0   diclofenac (VOLTAREN) 50 MG EC tablet Take 1 tablet (50 mg total) by mouth 2 (two) times daily as needed (headache). 60 tablet 0   medroxyPROGESTERone (DEPO-PROVERA) 150 MG/ML injection Inject 1 mL (150 mg total) into the muscle every 3 (three) months. 1 mL 3   metroNIDAZOLE (METROCREAM) 0.75 % cream Apply topically 2 (two) times daily.     No current facility-administered medications on file prior to visit.    No Known Allergies    Review of Systems Constitutional: negative for chills, fatigue, fevers and sweats Eyes: negative for irritation, redness and visual disturbance Ears, nose, mouth, throat, and face:  negative for hearing loss, nasal congestion, snoring and tinnitus Respiratory: negative for asthma, cough, sputum Cardiovascular: negative for chest pain, dyspnea, exertional chest pressure/discomfort, irregular heart beat, palpitations and syncope Gastrointestinal: negative for abdominal pain, change in bowel habits, nausea and vomiting Genitourinary: negative for abnormal menstrual periods, genital lesions, sexual problems and vaginal discharge, dysuria and urinary incontinence Integument/breast: negative for breast lump, breast tenderness and nipple discharge Hematologic/lymphatic: negative for bleeding and easy bruising Musculoskeletal:negative for back pain and muscle weakness Neurological: negative for dizziness, headaches, vertigo and weakness Endocrine: negative for diabetic symptoms including polydipsia, polyuria and skin dryness Allergic/Immunologic: negative for hay fever and urticaria       Objective:  Blood pressure 105/70, pulse 78, height 5\' 5"  (1.651 m), weight 196 lb 1.6 oz (89 kg), currently breastfeeding. Body mass index is 32.63 kg/m.  General Appearance:    Alert, cooperative, no distress, appears stated age, mild obesity  Head:    Normocephalic, without obvious abnormality, atraumatic  Eyes:    PERRL, conjunctiva/corneas clear, EOM's intact, both eyes  Ears:    Normal external ear canals, both ears  Nose:   Nares normal, septum midline, mucosa normal, no drainage or sinus tenderness  Throat:   Lips, mucosa, and tongue normal; teeth and gums normal  Neck:   Supple, symmetrical, trachea midline, no adenopathy; thyroid: no enlargement/tenderness/nodules; no carotid bruit or JVD  Back:     Symmetric, no curvature, ROM normal, no CVA tenderness  Lungs:     Clear to auscultation bilaterally, respirations unlabored  Chest Wall:    No tenderness or deformity   Heart:    Regular rate and rhythm, S1 and S2 normal, no murmur, rub or gallop  Breast Exam:    No tenderness,  masses, or nipple abnormality  Abdomen:     Soft, non-tender, bowel sounds active all four quadrants, no masses, no organomegaly.    Genitalia:    Pelvic:external genitalia normal, vagina without lesions, discharge, or tenderness, rectovaginal septum  normal. Cervix normal in appearance, no cervical motion tenderness, no adnexal masses or tenderness.  Uterus normal size, shape, mobile, regular contours, nontender.  Rectal:    Normal external sphincter.  No hemorrhoids appreciated. Internal exam not done.   Extremities:   Extremities normal, atraumatic, no cyanosis or edema  Pulses:   2+ and symmetric all extremities  Skin:   Skin color, texture, turgor normal, no rashes or lesions  Lymph nodes:   Cervical, supraclavicular, and axillary nodes normal  Neurologic:   CNII-XII intact, normal strength, sensation and reflexes throughout    Labs:  Lab Results  Component Value Date   WBC 7.6 02/14/2021   HGB  13.1 02/14/2021   HCT 38.7 02/14/2021   MCV 86 02/14/2021   PLT 376 02/14/2021    Lab Results  Component Value Date   CREATININE 0.92 02/14/2021   BUN 13 02/14/2021   NA 141 02/14/2021   K 4.7 02/14/2021   CL 107 (H) 02/14/2021   CO2 20 02/14/2021    Lab Results  Component Value Date   ALT 19 02/14/2021   AST 16 02/14/2021   ALKPHOS 104 02/14/2021   BILITOT 0.3 02/14/2021    Lab Results  Component Value Date   TSH 2.100 02/14/2021     Assessment:   1. Encounter for well woman exam with routine gynecological exam 2. Depo Provera contraception 3. Mild obesity   Plan:    - Blood tests: none ordered. Had labs with PCP.  - Breast self exam technique reviewed and patient encouraged to perform self-exam monthly. - Contraception: Depo Provera injections. Would like to try self-injections. Will prescribe SubQ formulation. Last injection was 03/31/21.  - Discussed healthy lifestyle modifications. - Pap smear up to date. - Flu vaccine declined.  Notes having the flu around  Thanksgiving.   - COVID vaccine declined.  - Return in 1 year for annual exam.     Rubie Maid, MD [facility]

## 2021-04-14 NOTE — Patient Instructions (Signed)
Preventive Care 21-29 Years Old, Female °Preventive care refers to lifestyle choices and visits with your health care provider that can promote health and wellness. Preventive care visits are also called wellness exams. °What can I expect for my preventive care visit? °Counseling °During your preventive care visit, your health care provider may ask about your: °Medical history, including: °Past medical problems. °Family medical history. °Pregnancy history. °Current health, including: °Menstrual cycle. °Method of birth control. °Emotional well-being. °Home life and relationship well-being. °Sexual activity and sexual health. °Lifestyle, including: °Alcohol, nicotine or tobacco, and drug use. °Access to firearms. °Diet, exercise, and sleep habits. °Work and work environment. °Sunscreen use. °Safety issues such as seatbelt and bike helmet use. °Physical exam °Your health care provider may check your: °Height and weight. These may be used to calculate your BMI (body mass index). BMI is a measurement that tells if you are at a healthy weight. °Waist circumference. This measures the distance around your waistline. This measurement also tells if you are at a healthy weight and may help predict your risk of certain diseases, such as type 2 diabetes and high blood pressure. °Heart rate and blood pressure. °Body temperature. °Skin for abnormal spots. °What immunizations do I need? °Vaccines are usually given at various ages, according to a schedule. Your health care provider will recommend vaccines for you based on your age, medical history, and lifestyle or other factors, such as travel or where you work. °What tests do I need? °Screening °Your health care provider may recommend screening tests for certain conditions. This may include: °Pelvic exam and Pap test. °Lipid and cholesterol levels. °Diabetes screening. This is done by checking your blood sugar (glucose) after you have not eaten for a while (fasting). °Hepatitis B  test. °Hepatitis C test. °HIV (human immunodeficiency virus) test. °STI (sexually transmitted infection) testing, if you are at risk. °BRCA-related cancer screening. This may be done if you have a family history of breast, ovarian, tubal, or peritoneal cancers. °Talk with your health care provider about your test results, treatment options, and if necessary, the need for more tests. °Follow these instructions at home: °Eating and drinking ° °Eat a healthy diet that includes fresh fruits and vegetables, whole grains, lean protein, and low-fat dairy products. °Take vitamin and mineral supplements as recommended by your health care provider. °Do not drink alcohol if: °Your health care provider tells you not to drink. °You are pregnant, may be pregnant, or are planning to become pregnant. °If you drink alcohol: °Limit how much you have to 0-1 drink a day. °Know how much alcohol is in your drink. In the [place] one drink equals one 12 oz bottle of beer (355 mL), one 5 oz glass of wine (148 mL), or one 1½ oz glass of hard liquor (44 mL). °Lifestyle °Brush your teeth every morning and night with fluoride toothpaste. Floss one time each day. °Exercise for at least 30 minutes 5 or more days each week. °Do not use any products that contain nicotine or tobacco. These products include cigarettes, chewing tobacco, and vaping devices, such as e-cigarettes. If you need help quitting, ask your health care provider. °Do not use drugs. °If you are sexually active, practice safe sex. Use a condom or other form of protection to prevent STIs. °If you do not wish to become pregnant, use a form of birth control. If you plan to become pregnant, see your health care provider for a prepregnancy visit. °Find healthy ways to manage stress, such as: °Meditation, yoga,   or listening to music. °Journaling. °Talking to a trusted person. °Spending time with friends and family. °Minimize exposure to UV radiation to reduce your risk of skin  cancer. °Safety °Always wear your seat belt while driving or riding in a vehicle. °Do not drive: °If you have been drinking alcohol. Do not ride with someone who has been drinking. °If you have been using any mind-altering substances or drugs. °While texting. °When you are tired or distracted. °Wear a helmet and other protective equipment during sports activities. °If you have firearms in your house, make sure you follow all gun safety procedures. °Seek help if you have been physically or sexually abused. °What's next? °Go to your health care provider once a year for an annual wellness visit. °Ask your health care provider how often you should have your eyes and teeth checked. °Stay up to date on all vaccines. °This information is not intended to replace advice given to you by your health care provider. Make sure you discuss any questions you have with your health care provider. °Document Revised: 09/22/2020 Document Reviewed: 09/22/2020 °Elsevier Patient Education © 2022 [employer] ° °

## 2021-04-14 NOTE — Telephone Encounter (Signed)
Noted, pharmacy aware.

## 2021-04-14 NOTE — Telephone Encounter (Signed)
Pharmacy called asking for another rx be placed for Depo- the one sent in today was for subcutaneous and the one they usually prescribe is IM- they ask we review and send in new RX.

## 2021-04-14 NOTE — Telephone Encounter (Signed)
Confirming that it will be SubQ. Patient will be giving herself the injections at home now.

## 2021-04-15 DIAGNOSIS — R768 Other specified abnormal immunological findings in serum: Secondary | ICD-10-CM | POA: Diagnosis not present

## 2021-04-15 DIAGNOSIS — L719 Rosacea, unspecified: Secondary | ICD-10-CM | POA: Diagnosis not present

## 2021-04-15 DIAGNOSIS — R202 Paresthesia of skin: Secondary | ICD-10-CM | POA: Diagnosis not present

## 2021-04-15 NOTE — Telephone Encounter (Signed)
error 

## 2021-04-27 ENCOUNTER — Ambulatory Visit: Payer: BC Managed Care – PPO | Admitting: Family Medicine

## 2021-06-28 DIAGNOSIS — L718 Other rosacea: Secondary | ICD-10-CM | POA: Diagnosis not present

## 2021-09-28 DIAGNOSIS — D235 Other benign neoplasm of skin of trunk: Secondary | ICD-10-CM | POA: Diagnosis not present

## 2021-09-28 DIAGNOSIS — Z792 Long term (current) use of antibiotics: Secondary | ICD-10-CM | POA: Diagnosis not present

## 2021-09-28 DIAGNOSIS — L718 Other rosacea: Secondary | ICD-10-CM | POA: Diagnosis not present

## 2022-03-23 DIAGNOSIS — Z03818 Encounter for observation for suspected exposure to other biological agents ruled out: Secondary | ICD-10-CM | POA: Diagnosis not present

## 2022-03-23 DIAGNOSIS — J019 Acute sinusitis, unspecified: Secondary | ICD-10-CM | POA: Diagnosis not present

## 2022-03-23 DIAGNOSIS — J40 Bronchitis, not specified as acute or chronic: Secondary | ICD-10-CM | POA: Diagnosis not present

## 2022-03-23 DIAGNOSIS — J101 Influenza due to other identified influenza virus with other respiratory manifestations: Secondary | ICD-10-CM | POA: Diagnosis not present

## 2022-07-12 ENCOUNTER — Other Ambulatory Visit: Payer: Self-pay

## 2022-07-12 ENCOUNTER — Encounter: Payer: Self-pay | Admitting: Family Medicine

## 2022-07-12 ENCOUNTER — Ambulatory Visit (INDEPENDENT_AMBULATORY_CARE_PROVIDER_SITE_OTHER): Payer: BC Managed Care – PPO | Admitting: Family Medicine

## 2022-07-12 VITALS — HR 91 | Ht 64.0 in | Wt 204.0 lb

## 2022-07-12 DIAGNOSIS — Z6835 Body mass index (BMI) 35.0-35.9, adult: Secondary | ICD-10-CM

## 2022-07-12 DIAGNOSIS — G8929 Other chronic pain: Secondary | ICD-10-CM

## 2022-07-12 DIAGNOSIS — R519 Headache, unspecified: Secondary | ICD-10-CM | POA: Diagnosis not present

## 2022-07-12 DIAGNOSIS — Z3042 Encounter for surveillance of injectable contraceptive: Secondary | ICD-10-CM

## 2022-07-12 DIAGNOSIS — Z7689 Persons encountering health services in other specified circumstances: Secondary | ICD-10-CM | POA: Diagnosis not present

## 2022-07-12 MED ORDER — PHENTERMINE HCL 15 MG PO CAPS: 15.0000 mg | ORAL_CAPSULE | ORAL | 0 refills | Status: DC

## 2022-07-12 MED ORDER — TOPIRAMATE 25 MG PO TABS: 25.0000 mg | ORAL_TABLET | Freq: Two times a day (BID) | ORAL | 1 refills | Status: DC

## 2022-07-12 MED ORDER — CYCLOBENZAPRINE HCL 5 MG PO TABS: 5.0000 mg | ORAL_TABLET | Freq: Three times a day (TID) | ORAL | 1 refills | Status: DC | PRN

## 2022-07-12 MED ORDER — DICLOFENAC SODIUM 50 MG PO TBEC: 50.0000 mg | DELAYED_RELEASE_TABLET | Freq: Two times a day (BID) | ORAL | 1 refills | Status: DC | PRN

## 2022-07-14 ENCOUNTER — Other Ambulatory Visit: Payer: Self-pay | Admitting: Obstetrics and Gynecology

## 2022-07-14 DIAGNOSIS — Z3042 Encounter for surveillance of injectable contraceptive: Secondary | ICD-10-CM

## 2022-07-16 DIAGNOSIS — Z7689 Persons encountering health services in other specified circumstances: Secondary | ICD-10-CM | POA: Insufficient documentation

## 2022-07-16 NOTE — Assessment & Plan Note (Signed)
Chronic, discussed treatment options. Plan as follows:  - phentermine 15 mg - topiramate 25 mg daily then BID - 1 month follow-up

## 2022-07-17 NOTE — Assessment & Plan Note (Signed)
Chronic issue, intermittently symptomatic, more recently noted after being off of diclofenac and cyclobenzaprine.  Given her response these medications tension type headache primarily considered.  -bRestart diclofenac and cyclobenzaprine on a as needed basis - Can pursue cervical spine imaging if symptoms persist

## 2022-07-17 NOTE — Progress Notes (Signed)
Primary Care / Sports Medicine Office Visit  Patient Information:  Patient ID: Jaime Mcintosh, female DOB: 1990/09/28 Age: 32 y.o. MRN: [id number]   Jaime Mcintosh is a pleasant 32 y.o. female presenting with the following:  Chief Complaint  Patient presents with   Obesity    Wants help to loose weight   Headache    `wants refills on meds    Vitals:   07/12/22 0807  Pulse: 91  SpO2: 98%   Vitals:   07/12/22 0807  Weight: 204 lb (92.5 kg)  Height: 5\' 4"  (1.626 m)   Body mass index is 35.02 kg/m.  No results found.   Independent interpretation of notes and tests performed by another provider:   None  Procedures performed:   None  Pertinent History, Exam, Impression, and Recommendations:   Jaime Mcintosh was seen today for obesity and headache.  Encounter for weight management Assessment & Plan: Chronic, discussed treatment options. Plan as follows:  - phentermine 15 mg - topiramate 25 mg daily then BID - 1 month follow-up  Orders: -     Topiramate; Take 1 tablet (25 mg total) by mouth 2 (two) times daily.  Dispense: 60 tablet; Refill: 1 -     Phentermine HCl; Take 1 capsule (15 mg total) by mouth every morning.  Dispense: 30 capsule; Refill: 0  BMI 35.0-35.9,adult -     Topiramate; Take 1 tablet (25 mg total) by mouth 2 (two) times daily.  Dispense: 60 tablet; Refill: 1 -     Phentermine HCl; Take 1 capsule (15 mg total) by mouth every morning.  Dispense: 30 capsule; Refill: 0  Chronic intractable headache, unspecified headache type Assessment & Plan: Chronic issue, intermittently symptomatic, more recently noted after being off of diclofenac and cyclobenzaprine.  Given her response these medications tension type headache primarily considered.  -bRestart diclofenac and cyclobenzaprine on a as needed basis - Can pursue cervical spine imaging if symptoms persist  Orders: -     Diclofenac Sodium; Take 1 tablet (50 mg total) by mouth 2 (two) times daily as  needed (headache).  Dispense: 60 tablet; Refill: 1 -     Cyclobenzaprine HCl; Take 1 tablet (5 mg total) by mouth 3 (three) times daily as needed (head/neck tightness).  Dispense: 30 tablet; Refill: 1     Orders & Medications Meds ordered this encounter  Medications   topiramate (TOPAMAX) 25 MG tablet    Sig: Take 1 tablet (25 mg total) by mouth 2 (two) times daily.    Dispense:  60 tablet    Refill:  1   phentermine 15 MG capsule    Sig: Take 1 capsule (15 mg total) by mouth every morning.    Dispense:  30 capsule    Refill:  0   diclofenac (VOLTAREN) 50 MG EC tablet    Sig: Take 1 tablet (50 mg total) by mouth 2 (two) times daily as needed (headache).    Dispense:  60 tablet    Refill:  1   cyclobenzaprine (FLEXERIL) 5 MG tablet    Sig: Take 1 tablet (5 mg total) by mouth 3 (three) times daily as needed (head/neck tightness).    Dispense:  30 tablet    Refill:  1   No orders of the defined types were placed in this encounter.    Return in about 2 months (around 09/11/2022).     Jerrol Banana, MD, [facility]   Primary Care Sports Medicine Primary Care and  Sports Medicine at [employer]

## 2022-07-18 ENCOUNTER — Other Ambulatory Visit: Payer: Self-pay

## 2022-07-18 DIAGNOSIS — Z3042 Encounter for surveillance of injectable contraceptive: Secondary | ICD-10-CM

## 2022-07-18 MED ORDER — DEPO-SUBQ PROVERA 104 104 MG/0.65ML ~~LOC~~ SUSY: 104.0000 mg | PREFILLED_SYRINGE | SUBCUTANEOUS | 0 refills | Status: DC

## 2022-08-09 ENCOUNTER — Other Ambulatory Visit: Payer: Self-pay | Admitting: Family Medicine

## 2022-08-09 ENCOUNTER — Encounter: Payer: Self-pay | Admitting: Family Medicine

## 2022-08-09 DIAGNOSIS — Z6835 Body mass index (BMI) 35.0-35.9, adult: Secondary | ICD-10-CM

## 2022-08-09 DIAGNOSIS — Z7689 Persons encountering health services in other specified circumstances: Secondary | ICD-10-CM

## 2022-08-09 MED ORDER — PHENTERMINE HCL 15 MG PO CAPS: 15.0000 mg | ORAL_CAPSULE | ORAL | 0 refills | Status: DC

## 2022-08-09 NOTE — Telephone Encounter (Signed)
Please advise 

## 2022-08-16 ENCOUNTER — Ambulatory Visit (INDEPENDENT_AMBULATORY_CARE_PROVIDER_SITE_OTHER): Payer: BC Managed Care – PPO | Admitting: Family Medicine

## 2022-08-16 ENCOUNTER — Encounter: Payer: Self-pay | Admitting: Family Medicine

## 2022-08-16 VITALS — BP 120/78 | HR 96 | Ht 64.0 in | Wt 190.0 lb

## 2022-08-16 DIAGNOSIS — Z7689 Persons encountering health services in other specified circumstances: Secondary | ICD-10-CM

## 2022-08-16 DIAGNOSIS — Z6835 Body mass index (BMI) 35.0-35.9, adult: Secondary | ICD-10-CM

## 2022-08-16 MED ORDER — TOPIRAMATE 25 MG PO TABS: 25.0000 mg | ORAL_TABLET | Freq: Two times a day (BID) | ORAL | 2 refills | Status: DC

## 2022-08-16 MED ORDER — PHENTERMINE HCL 15 MG PO CAPS: 15.0000 mg | ORAL_CAPSULE | ORAL | 0 refills | Status: DC

## 2022-08-16 NOTE — Assessment & Plan Note (Signed)
Excellent interim weight loss, patient has made significant adjunct dietary/lifestyle changes.  Denies any adverse effects regarding sleep, mood, gastrointestinal, cardiac, etc.  Given her noted weight loss, plan as follows: - Continue phentermine 15 mg daily - Dose topiramate 25 mg twice daily - Return for annual physical/weight check in 3 months - She is to contact us monthly for refills (for a maximum of 2 refills until return), if wishing to titrate, would need an office visit

## 2022-08-16 NOTE — Patient Instructions (Signed)
-   Continue phentermine 50 mg daily - Dose topiramate 25 mg twice daily - Continue healthy lifestyle changes as you have been doing - Monitor for any side effects and address accordingly - Can contact us for refills/questions/concerns - Return for annual physical check in 3 months

## 2022-08-16 NOTE — Progress Notes (Signed)
     Primary Care / Sports Medicine Office Visit  Patient Information:  Patient ID: Jaime Mcintosh, female DOB: 10/29/1990 Age: 32 y.o. MRN: [id number]   Jaime Mcintosh is a pleasant 32 y.o. female presenting with the following:  Chief Complaint  Patient presents with   Weight Check    Vitals:   08/16/22 0805  BP: 120/78  Pulse: 96  SpO2: 99%   Vitals:   08/16/22 0805  Weight: 190 lb (86.2 kg)  Height: 5\' 4"  (1.626 m)   Body mass index is 32.61 kg/m.  No results found.   Independent interpretation of notes and tests performed by another provider:   None  Procedures performed:   None  Pertinent History, Exam, Impression, and Recommendations:   Jaime Mcintosh was seen today for weight check.  BMI 35.0-35.9,adult -     Phentermine HCl; Take 1 capsule (15 mg total) by mouth every morning.  Dispense: 30 capsule; Refill: 0 -     Topiramate; Take 1 tablet (25 mg total) by mouth 2 (two) times daily.  Dispense: 60 tablet; Refill: 2  Encounter for weight management Assessment & Plan: Excellent interim weight loss, patient has made significant adjunct dietary/lifestyle changes.  Denies any adverse effects regarding sleep, mood, gastrointestinal, cardiac, etc.  Given her noted weight loss, plan as follows: - Continue phentermine 15 mg daily - Dose topiramate 25 mg twice daily - Return for annual physical/weight check in 3 months - She is to contact us monthly for refills (for a maximum of 2 refills until return), if wishing to titrate, would need an office visit  Orders: -     Phentermine HCl; Take 1 capsule (15 mg total) by mouth every morning.  Dispense: 30 capsule; Refill: 0 -     Topiramate; Take 1 tablet (25 mg total) by mouth 2 (two) times daily.  Dispense: 60 tablet; Refill: 2     Orders & Medications Meds ordered this encounter  Medications   phentermine 15 MG capsule    Sig: Take 1 capsule (15 mg total) by mouth every morning.    Dispense:  30 capsule     Refill:  0   topiramate (TOPAMAX) 25 MG tablet    Sig: Take 1 tablet (25 mg total) by mouth 2 (two) times daily.    Dispense:  60 tablet    Refill:  2   No orders of the defined types were placed in this encounter.    No follow-ups on file.     Jerrol Banana, MD, [facility]   Primary Care Sports Medicine Primary Care and Sports Medicine at [facility]

## 2022-09-05 ENCOUNTER — Other Ambulatory Visit: Payer: Self-pay | Admitting: Family Medicine

## 2022-09-05 DIAGNOSIS — R519 Headache, unspecified: Secondary | ICD-10-CM

## 2022-09-06 NOTE — Telephone Encounter (Signed)
Requested medication (s) are due for refill today: yes  Requested medication (s) are on the active medication list: yes  Last refill:  07/12/22  Future visit scheduled: yes  Notes to clinic:  Unable to refill per protocol due to failed labs, no updated results.      Requested Prescriptions  Pending Prescriptions Disp Refills   diclofenac (VOLTAREN) 50 MG EC tablet [Pharmacy Med Name: Diclofenac Sodium 50 MG Oral Tablet Delayed Release] 60 tablet 0    Sig: TAKE 1 TABLET BY MOUTH TWICE DAILY AS NEEDED FOR  HEADACHE     Analgesics:  NSAIDS Failed - 09/05/2022  6:44 AM      Failed - Manual Review: Labs are only required if the patient has taken medication for more than 8 weeks.      Failed - Cr in normal range and within 360 days    Creatinine, Ser  Date Value Ref Range Status  02/14/2021 0.92 0.57 - 1.00 mg/dL Final   Creatinine, Urine  Date Value Ref Range Status  07/02/2016 82 mg/dL Final         Failed - HGB in normal range and within 360 days    Hemoglobin  Date Value Ref Range Status  02/14/2021 13.1 11.1 - 15.9 g/dL Final         Failed - PLT in normal range and within 360 days    Platelets  Date Value Ref Range Status  02/14/2021 376 150 - 450 x10E3/uL Final         Failed - HCT in normal range and within 360 days    Hematocrit  Date Value Ref Range Status  02/14/2021 38.7 34.0 - 46.6 % Final         Failed - eGFR is 30 or above and within 360 days    GFR calc Af Amer  Date Value Ref Range Status  03/11/2019 118 >59 mL/min/1.73 Final   GFR calc non Af Amer  Date Value Ref Range Status  03/11/2019 102 >59 mL/min/1.73 Final   GFR  Date Value Ref Range Status  12/31/2012 81.26 >60.00 mL/min Final   eGFR  Date Value Ref Range Status  02/14/2021 87 >59 mL/min/1.73 Final         Passed - Patient is not pregnant      Passed - Valid encounter within last 12 months    Recent Outpatient Visits           3 weeks ago Encounter for weight management    [place] [facility] at [facility] Ashley Royalty, Ocie Bob, MD   1 month ago Encounter for weight management   [place] [facility], Ocie Bob, MD   1 year ago Annual physical exam   [facility] at [facility], Ocie Bob, MD   1 year ago Chronic intractable headache, unspecified headache type   [facility] Care & [facility] Medicine at [facility], Ocie Bob, MD       Future Appointments             In 2 months Ashley Royalty, Ocie Bob, MD [facility] Primary Care & Sports Medicine at [facility], [facility]

## 2022-09-06 NOTE — Telephone Encounter (Signed)
Left voice mail to set up med refill  appointment

## 2022-09-13 ENCOUNTER — Other Ambulatory Visit: Payer: Self-pay | Admitting: Family Medicine

## 2022-09-13 DIAGNOSIS — Z7689 Persons encountering health services in other specified circumstances: Secondary | ICD-10-CM

## 2022-09-13 DIAGNOSIS — Z6835 Body mass index (BMI) 35.0-35.9, adult: Secondary | ICD-10-CM

## 2022-09-13 NOTE — Telephone Encounter (Signed)
Please review.  KP

## 2022-09-14 ENCOUNTER — Encounter: Payer: BC Managed Care – PPO | Admitting: Family Medicine

## 2022-09-15 ENCOUNTER — Encounter: Payer: Self-pay | Admitting: Family Medicine

## 2022-09-15 MED ORDER — PHENTERMINE HCL 15 MG PO CAPS: 15.0000 mg | ORAL_CAPSULE | ORAL | 0 refills | Status: DC

## 2022-10-03 ENCOUNTER — Ambulatory Visit: Payer: BC Managed Care – PPO | Admitting: Obstetrics and Gynecology

## 2022-10-10 ENCOUNTER — Ambulatory Visit: Payer: BC Managed Care – PPO | Admitting: Obstetrics and Gynecology

## 2022-10-17 ENCOUNTER — Other Ambulatory Visit: Payer: Self-pay | Admitting: Family Medicine

## 2022-10-17 DIAGNOSIS — Z6835 Body mass index (BMI) 35.0-35.9, adult: Secondary | ICD-10-CM

## 2022-10-17 DIAGNOSIS — Z7689 Persons encountering health services in other specified circumstances: Secondary | ICD-10-CM

## 2022-10-17 NOTE — Telephone Encounter (Signed)
Please advise 

## 2022-10-18 ENCOUNTER — Other Ambulatory Visit: Payer: Self-pay | Admitting: Family Medicine

## 2022-10-18 DIAGNOSIS — Z6835 Body mass index (BMI) 35.0-35.9, adult: Secondary | ICD-10-CM

## 2022-10-18 DIAGNOSIS — Z7689 Persons encountering health services in other specified circumstances: Secondary | ICD-10-CM

## 2022-10-18 MED ORDER — PHENTERMINE HCL 15 MG PO CAPS: 15.0000 mg | ORAL_CAPSULE | ORAL | 0 refills | Status: DC

## 2022-11-01 NOTE — Progress Notes (Deleted)
GYNECOLOGY ANNUAL PHYSICAL EXAM PROGRESS NOTE  Subjective:    Jaime Mcintosh is a 32 y.o. G81P2002 female who presents for an annual exam. The patient has no complaints today. The patient is sexually active. The patient participates in regular exercise: no. Has the patient ever been transfused or tattooed?: yes. The patient reports that there is not domestic violence in her life.    Menstrual History: Menarche age: 35 No LMP recorded. Patient has had an injection.     Gynecologic History:  Contraception: Depo-Provera injections History of STI's: Denies Last Pap: 03/11/2019. Results were: normal.  Denies h/o abnormal pap smears.  Last mammogram: Not age appropriate      OB History  Gravida Para Term Preterm AB Living  2 2 2  0 0 2  SAB IAB Ectopic Multiple Live Births  0 0 0 0 2    # Outcome Date GA Lbr Len/2nd Weight Sex Type Anes PTL Lv  2 Term 09/04/20 [redacted]w[redacted]d 09:37 / 00:05 7 lb 5.5 oz (3.33 kg) F Vag-Spont EPI  LIV     Name: Tiller,GIRL Kandyce     Apgar1: 7  Apgar5: 9  1 Term 07/05/16 104w0d  9 lb 6.6 oz (4.27 kg) M Vag-Spont EPI  LIV     Name: Vecchione,BOY Chrissi     Apgar1: 2  Apgar5: 7    Past Medical History:  Diagnosis Date   Headache    hx migraines   History of pre-eclampsia 06/2016   mild    Past Surgical History:  Procedure Laterality Date   NO PAST SURGERIES      Family History  Problem Relation Age of Onset   Skin cancer Mother    Hyperlipidemia Father    Hypokalemia Sister    Arthritis Maternal Grandmother    Hypertension Maternal Grandmother    Varicose Veins Maternal Grandmother    Heart disease Maternal Grandfather    Heart attack Maternal Grandfather    Coronary artery disease Maternal Grandfather    Thyroid disease Paternal Grandmother    Cataracts Paternal Grandmother    Diabetes Paternal Grandfather    Stroke Neg Hx    Breast cancer Neg Hx     Social History   Socioeconomic History   Marital status: Divorced    Spouse name:  Not on file   Number of children: 2   Years of education: 12+   Highest education level: Some college, no degree  Occupational History   Occupation: [employer]  Tobacco Use   Smoking status: Never   Smokeless tobacco: Never  Vaping Use   Vaping status: Never Used  Substance and Sexual Activity   Alcohol use: Not Currently   Drug use: Never   Sexual activity: Yes    Birth control/protection: Injection  Other Topics Concern   Not on file  Social History Narrative   Works nights   Social Determinants of Health   Financial Resource Strain: Not on file  Food Insecurity: Not on file  Transportation Needs: Not on file  Physical Activity: Not on file  Stress: Not on file  Social Connections: Not on file  Intimate Partner Violence: Not on file    Current Outpatient Medications on File Prior to Visit  Medication Sig Dispense Refill   aspirin-acetaminophen-caffeine (EXCEDRIN MIGRAINE) 250-250-65 MG tablet Take by mouth every 6 (six) hours as needed for headache.     cyclobenzaprine (FLEXERIL) 5 MG tablet Take 1 tablet (5 mg total) by mouth 3 (three) times daily as needed (  head/neck tightness). 30 tablet 1   diclofenac (VOLTAREN) 50 MG EC tablet Take 1 tablet (50 mg total) by mouth 2 (two) times daily as needed (headache). 60 tablet 1   medroxyPROGESTERone (DEPO-SUBQ PROVERA 104) 104 MG/0.65ML injection Inject 0.65 mLs (104 mg total) into the skin every 3 (three) months. 0.65 mL 0   phentermine 15 MG capsule Take 1 capsule (15 mg total) by mouth every morning. 30 capsule 0   topiramate (TOPAMAX) 25 MG tablet Take 1 tablet (25 mg total) by mouth 2 (two) times daily. 60 tablet 2   No current facility-administered medications on file prior to visit.    No Known Allergies   Review of Systems Constitutional: negative for chills, fatigue, fevers and sweats Eyes: negative for irritation, redness and visual disturbance Ears, nose, mouth, throat, and face: negative for hearing loss, nasal  congestion, snoring and tinnitus Respiratory: negative for asthma, cough, sputum Cardiovascular: negative for chest pain, dyspnea, exertional chest pressure/discomfort, irregular heart beat, palpitations and syncope Gastrointestinal: negative for abdominal pain, change in bowel habits, nausea and vomiting Genitourinary: negative for abnormal menstrual periods, genital lesions, sexual problems and vaginal discharge, dysuria and urinary incontinence Integument/breast: negative for breast lump, breast tenderness and nipple discharge Hematologic/lymphatic: negative for bleeding and easy bruising Musculoskeletal:negative for back pain and muscle weakness Neurological: negative for dizziness, headaches, vertigo and weakness Endocrine: negative for diabetic symptoms including polydipsia, polyuria and skin dryness Allergic/Immunologic: negative for hay fever and urticaria      Objective:  currently breastfeeding. There is no height or weight on file to calculate BMI.    General Appearance:    Alert, cooperative, no distress, appears stated age  Head:    Normocephalic, without obvious abnormality, atraumatic  Eyes:    PERRL, conjunctiva/corneas clear, EOM's intact, both eyes  Ears:    Normal external ear canals, both ears  Nose:   Nares normal, septum midline, mucosa normal, no drainage or sinus tenderness  Throat:   Lips, mucosa, and tongue normal; teeth and gums normal  Neck:   Supple, symmetrical, trachea midline, no adenopathy; thyroid: no enlargement/tenderness/nodules; no carotid bruit or JVD  Back:     Symmetric, no curvature, ROM normal, no CVA tenderness  Lungs:     Clear to auscultation bilaterally, respirations unlabored  Chest Wall:    No tenderness or deformity   Heart:    Regular rate and rhythm, S1 and S2 normal, no murmur, rub or gallop  Breast Exam:    No tenderness, masses, or nipple abnormality  Abdomen:     Soft, non-tender, bowel sounds active all four quadrants, no masses,  no organomegaly.    Genitalia:    Pelvic:external genitalia normal, vagina without lesions, discharge, or tenderness, rectovaginal septum  normal. Cervix normal in appearance, no cervical motion tenderness, no adnexal masses or tenderness.  Uterus normal size, shape, mobile, regular contours, nontender.  Rectal:    Normal external sphincter.  No hemorrhoids appreciated. Internal exam not done.   Extremities:   Extremities normal, atraumatic, no cyanosis or edema  Pulses:   2+ and symmetric all extremities  Skin:   Skin color, texture, turgor normal, no rashes or lesions  Lymph nodes:   Cervical, supraclavicular, and axillary nodes normal  Neurologic:   CNII-XII intact, normal strength, sensation and reflexes throughout   .  Labs:  Lab Results  Component Value Date   WBC 7.6 02/14/2021   HGB 13.1 02/14/2021   HCT 38.7 02/14/2021   MCV 86 02/14/2021  PLT 376 02/14/2021    Lab Results  Component Value Date   CREATININE 0.92 02/14/2021   BUN 13 02/14/2021   NA 141 02/14/2021   K 4.7 02/14/2021   CL 107 (H) 02/14/2021   CO2 20 02/14/2021    Lab Results  Component Value Date   ALT 19 02/14/2021   AST 16 02/14/2021   ALKPHOS 104 02/14/2021   BILITOT 0.3 02/14/2021    Lab Results  Component Value Date   TSH 2.100 02/14/2021     Assessment:   No diagnosis found.   Plan:  Blood tests: {blood tests:13147}. Breast self exam technique reviewed and patient encouraged to perform self-exam monthly. Contraception: Depo-Provera injections. Discussed healthy lifestyle modifications. Mammogram  : Not age appropriate Pap smear ordered. COVID vaccination status: Follow up in 1 year for annual exam   Hildred Laser, MD [facility]

## 2022-11-02 ENCOUNTER — Ambulatory Visit: Payer: BC Managed Care – PPO | Admitting: Obstetrics and Gynecology

## 2022-11-02 DIAGNOSIS — Z131 Encounter for screening for diabetes mellitus: Secondary | ICD-10-CM

## 2022-11-02 DIAGNOSIS — Z13220 Encounter for screening for lipoid disorders: Secondary | ICD-10-CM

## 2022-11-02 DIAGNOSIS — Z124 Encounter for screening for malignant neoplasm of cervix: Secondary | ICD-10-CM

## 2022-11-02 DIAGNOSIS — Z01419 Encounter for gynecological examination (general) (routine) without abnormal findings: Secondary | ICD-10-CM

## 2022-11-16 ENCOUNTER — Ambulatory Visit (INDEPENDENT_AMBULATORY_CARE_PROVIDER_SITE_OTHER): Payer: BC Managed Care – PPO | Admitting: Family Medicine

## 2022-11-16 ENCOUNTER — Encounter: Payer: Self-pay | Admitting: Family Medicine

## 2022-11-16 VITALS — BP 112/76 | HR 95 | Ht 64.0 in | Wt 173.0 lb

## 2022-11-16 DIAGNOSIS — G5603 Carpal tunnel syndrome, bilateral upper limbs: Secondary | ICD-10-CM

## 2022-11-16 DIAGNOSIS — Z7689 Persons encountering health services in other specified circumstances: Secondary | ICD-10-CM

## 2022-11-16 DIAGNOSIS — R519 Headache, unspecified: Secondary | ICD-10-CM

## 2022-11-16 DIAGNOSIS — Z8669 Personal history of other diseases of the nervous system and sense organs: Secondary | ICD-10-CM

## 2022-11-16 DIAGNOSIS — L719 Rosacea, unspecified: Secondary | ICD-10-CM

## 2022-11-16 DIAGNOSIS — E559 Vitamin D deficiency, unspecified: Secondary | ICD-10-CM

## 2022-11-16 DIAGNOSIS — Z13220 Encounter for screening for lipoid disorders: Secondary | ICD-10-CM

## 2022-11-16 DIAGNOSIS — Z0000 Encounter for general adult medical examination without abnormal findings: Secondary | ICD-10-CM

## 2022-11-16 DIAGNOSIS — G8929 Other chronic pain: Secondary | ICD-10-CM

## 2022-11-16 HISTORY — DX: Carpal tunnel syndrome, bilateral upper limbs: G56.03

## 2022-11-16 MED ORDER — CYCLOBENZAPRINE HCL 5 MG PO TABS
5.0000 mg | ORAL_TABLET | Freq: Three times a day (TID) | ORAL | 1 refills | Status: DC | PRN
Start: 2022-11-16 — End: 2023-11-20

## 2022-11-16 MED ORDER — DICLOFENAC SODIUM 50 MG PO TBEC
50.0000 mg | DELAYED_RELEASE_TABLET | Freq: Two times a day (BID) | ORAL | 1 refills | Status: DC | PRN
Start: 2022-11-16 — End: 2023-02-26

## 2022-11-16 NOTE — Assessment & Plan Note (Addendum)
Has improved with topiramate dosing, will titrate up due to ongoing intermittent episodes.  - Can trial increase to 50 mg to assess response - Will contact us for refill of 25 or 50 mg

## 2022-11-16 NOTE — Progress Notes (Signed)
Annual Physical Exam Visit  Patient Information:  Patient ID: Jaime Mcintosh, female DOB: 11/14/1990 Age: 32 y.o. MRN: [id number]   Subjective:   CC: Annual Physical Exam  HPI:  Jaime Mcintosh is here for their annual physical.  I reviewed the past medical history, family history, social history, surgical history, and allergies today and changes were made as necessary.  Please see the problem list section below for additional details.  Past Medical History: Past Medical History:  Diagnosis Date   Headache    hx migraines   History of pre-eclampsia 06/2016   mild   Past Surgical History: Past Surgical History:  Procedure Laterality Date   NO PAST SURGERIES     Family History: Family History  Problem Relation Age of Onset   Skin cancer Mother    Hyperlipidemia Father    Hypokalemia Sister    Arthritis Maternal Grandmother    Hypertension Maternal Grandmother    Varicose Veins Maternal Grandmother    Heart disease Maternal Grandfather    Heart attack Maternal Grandfather    Coronary artery disease Maternal Grandfather    Thyroid disease Paternal Grandmother    Cataracts Paternal Grandmother    Diabetes Paternal Grandfather    Stroke Neg Hx    Breast cancer Neg Hx    Allergies: No Known Allergies Health Maintenance: Health Maintenance  Topic Date Due   PAP SMEAR-Modifier  03/10/2022   COVID-19 Vaccine (1 - 2023-24 season) 12/02/2022 (Originally 12/09/2021)   INFLUENZA VACCINE  07/09/2023 (Originally 11/09/2022)   DTaP/Tdap/Td (3 - Td or Tdap) 06/11/2030   Hepatitis C Screening  Completed   HIV Screening  Completed   HPV VACCINES  Aged Out    HM Colonoscopy     This patient has no relevant Health Maintenance data.      Medications: Current Outpatient Medications on File Prior to Visit  Medication Sig Dispense Refill   aspirin-acetaminophen-caffeine (EXCEDRIN MIGRAINE) 250-250-65 MG tablet Take by mouth every 6 (six) hours as needed for headache.      medroxyPROGESTERone (DEPO-SUBQ PROVERA 104) 104 MG/0.65ML injection Inject 0.65 mLs (104 mg total) into the skin every 3 (three) months. 0.65 mL 0   phentermine 15 MG capsule Take 1 capsule (15 mg total) by mouth every morning. 30 capsule 0   topiramate (TOPAMAX) 25 MG tablet Take 1 tablet (25 mg total) by mouth 2 (two) times daily. 60 tablet 2   No current facility-administered medications on file prior to visit.    Review of Systems: +headache, visual changes, nausea, vomiting, diarrhea, constipation, dizziness, abdominal pain, skin rash, fevers, chills, night sweats, swollen lymph nodes, weight loss, chest pain, body aches, joint swelling, muscle aches, shortness of breath, mood changes, visual or auditory hallucinations reported.  Objective:   Vitals:   11/16/22 0756  BP: 112/76  Pulse: 95  SpO2: 94%   Vitals:   11/16/22 0756  Weight: 173 lb (78.5 kg)  Height: 5\' 4"  (1.626 m)   Body mass index is 29.7 kg/m.  General: Well Developed, well nourished, and in no acute distress.  Neuro: Alert and oriented x3, extra-ocular muscles intact, sensation grossly intact. Cranial nerves II through XII are grossly intact, motor, sensory, and coordinative functions are intact. HEENT: Normocephalic, atraumatic, pupils equal round reactive to light, neck supple, no masses, no lymphadenopathy, thyroid nonpalpable. Oropharynx, nasopharynx, external ear canals are unremarkable. Skin: Warm and dry, no rashes noted.  Cardiac: Regular rate and rhythm, no murmurs rubs or gallops. No peripheral  edema. Pulses symmetric. Respiratory: Clear to auscultation bilaterally. Not using accessory muscles, speaking in full sentences.  Abdominal: Soft, nontender, nondistended, positive bowel sounds, no masses, no organomegaly. Musculoskeletal: Shoulder, elbow, wrist, hip, knee, ankle stable, and with full range of motion.  Female chaperone initials: AS present throughout the physical examination.  Impression and  Recommendations:   The patient was counselled, risk factors were discussed, and anticipatory guidance given.  Problem List Items Addressed This Visit       Nervous and Auditory   Carpal tunnel syndrome, bilateral    Had been doing very well with consistent night splint usage, some compliance related recurrence.  - Restart night splints      Relevant Medications   cyclobenzaprine (FLEXERIL) 5 MG tablet     Other   Rosacea    Diagnosed after seeing dermatology, stable      History of migraine    Has improved with topiramate dosing, will titrate up due to ongoing intermittent episodes.  - Can trial increase to 50 mg to assess response - Will contact us for refill of 25 or 50 mg      Healthcare maintenance - Primary    Annual examination completed, risk stratification labs ordered, anticipatory guidance provided.  We will follow labs once resulted.  Additionally needs to check-in with GYN for cervical cancer screening, she is working on scheduling.      Relevant Medications   cyclobenzaprine (FLEXERIL) 5 MG tablet   diclofenac (VOLTAREN) 50 MG EC tablet   Other Relevant Orders   Comprehensive metabolic panel   Lipid panel   TSH   VITAMIN D 25 Hydroxy (Vit-D Deficiency, Fractures)   CBC   Encounter for weight management    Significant interim weight loss with phentermine and topiramate low doses with lifestyle changes. No adverse effects reported after review.  - Continue phentermine 15 mg daily - Titrate topiramate to 50 mg for tighter control with this and comorbid migraines, see additional assessment(s) for plan details.      Chronic intractable headache    Responded well to PRN Flexeril and diclofenac. Refilled.      Relevant Medications   cyclobenzaprine (FLEXERIL) 5 MG tablet   diclofenac (VOLTAREN) 50 MG EC tablet   Annual physical exam   Relevant Medications   cyclobenzaprine (FLEXERIL) 5 MG tablet   diclofenac (VOLTAREN) 50 MG EC tablet   Other  Relevant Orders   Comprehensive metabolic panel   Lipid panel   TSH   VITAMIN D 25 Hydroxy (Vit-D Deficiency, Fractures)   CBC   Other Visit Diagnoses     Vitamin D deficiency       Relevant Orders   VITAMIN D 25 Hydroxy (Vit-D Deficiency, Fractures)   Screening for lipoid disorders       Relevant Orders   Comprehensive metabolic panel   Lipid panel        Orders & Medications Medications:  Meds ordered this encounter  Medications   cyclobenzaprine (FLEXERIL) 5 MG tablet    Sig: Take 1 tablet (5 mg total) by mouth 3 (three) times daily as needed (head/neck tightness).    Dispense:  30 tablet    Refill:  1   diclofenac (VOLTAREN) 50 MG EC tablet    Sig: Take 1 tablet (50 mg total) by mouth 2 (two) times daily as needed (headache).    Dispense:  60 tablet    Refill:  1   Orders Placed This Encounter  Procedures   Comprehensive metabolic panel  Lipid panel   TSH   VITAMIN D 25 Hydroxy (Vit-D Deficiency, Fractures)   CBC     No follow-ups on file.    Jerrol Banana, MD, [facility]   Primary Care Sports Medicine Primary Care and Sports Medicine at [facility]

## 2022-11-16 NOTE — Assessment & Plan Note (Signed)
Significant interim weight loss with phentermine and topiramate low doses with lifestyle changes. No adverse effects reported after review.  - Continue phentermine 15 mg daily - Titrate topiramate to 50 mg for tighter control with this and comorbid migraines, see additional assessment(s) for plan details.

## 2022-11-16 NOTE — Assessment & Plan Note (Signed)
Responded well to PRN Flexeril and diclofenac. Refilled.

## 2022-11-16 NOTE — Assessment & Plan Note (Signed)
Diagnosed after seeing dermatology, stable

## 2022-11-16 NOTE — Assessment & Plan Note (Signed)
Annual examination completed, risk stratification labs ordered, anticipatory guidance provided.  We will follow labs once resulted.  Additionally needs to check-in with GYN for cervical cancer screening, she is working on scheduling.

## 2022-11-16 NOTE — Assessment & Plan Note (Signed)
Had been doing very well with consistent night splint usage, some compliance related recurrence.  - Restart night splints

## 2022-11-16 NOTE — Patient Instructions (Addendum)
-   Obtain fasting labs with orders provided (can have water or black coffee but otherwise no food or drink x 8 hours before labs) - Review information provided - Attend eye doctor annually, dentist every 6 months, work towards or maintain 30 minutes of moderate intensity physical activity at least 5 days per week, and consume a balanced diet - Return in 1 year for physical - Contact us for any questions between now and then  Additionally - Trial increase of topiramate to 50 mg and contact us for appropriate refill (25 or 50 mg) - Restart night splints - Follow-up with gynecology for routine screening

## 2022-11-21 ENCOUNTER — Other Ambulatory Visit: Payer: Self-pay | Admitting: Family Medicine

## 2022-11-21 ENCOUNTER — Encounter: Payer: Self-pay | Admitting: Family Medicine

## 2022-11-21 DIAGNOSIS — Z7689 Persons encountering health services in other specified circumstances: Secondary | ICD-10-CM

## 2022-11-21 DIAGNOSIS — Z6835 Body mass index (BMI) 35.0-35.9, adult: Secondary | ICD-10-CM

## 2022-11-21 MED ORDER — TOPIRAMATE 25 MG PO TABS
25.0000 mg | ORAL_TABLET | Freq: Two times a day (BID) | ORAL | 2 refills | Status: DC
Start: 2022-11-21 — End: 2022-12-01

## 2022-11-21 MED ORDER — PHENTERMINE HCL 15 MG PO CAPS
15.0000 mg | ORAL_CAPSULE | ORAL | 2 refills | Status: DC
Start: 2022-11-21 — End: 2023-02-16

## 2022-11-21 NOTE — Telephone Encounter (Signed)
fyi

## 2022-11-21 NOTE — Telephone Encounter (Signed)
Please review.  KP

## 2022-12-01 ENCOUNTER — Encounter: Payer: Self-pay | Admitting: Family Medicine

## 2022-12-01 ENCOUNTER — Other Ambulatory Visit: Payer: Self-pay

## 2022-12-01 DIAGNOSIS — Z7689 Persons encountering health services in other specified circumstances: Secondary | ICD-10-CM

## 2022-12-01 DIAGNOSIS — Z6835 Body mass index (BMI) 35.0-35.9, adult: Secondary | ICD-10-CM

## 2022-12-01 MED ORDER — TOPIRAMATE 50 MG PO TABS: 50.0000 mg | ORAL_TABLET | Freq: Two times a day (BID) | ORAL | 0 refills | Status: DC

## 2022-12-25 ENCOUNTER — Encounter: Payer: Self-pay | Admitting: Family Medicine

## 2023-01-30 ENCOUNTER — Telehealth: Payer: Self-pay

## 2023-01-30 NOTE — Telephone Encounter (Signed)
Completed PA on [email or web address] for Phentermine 15 mg.  (KeyGunnar Bulla) PA Case ID #: [id number] Rx #: [id number]  Awaiting outcome.

## 2023-02-02 NOTE — Telephone Encounter (Signed)
Phentermine was Approved until 01/30/24.  - Jaime Mcintosh

## 2023-02-16 ENCOUNTER — Ambulatory Visit: Payer: BC Managed Care – PPO | Admitting: Family Medicine

## 2023-02-16 ENCOUNTER — Encounter: Payer: Self-pay | Admitting: Family Medicine

## 2023-02-16 DIAGNOSIS — Z6835 Body mass index (BMI) 35.0-35.9, adult: Secondary | ICD-10-CM

## 2023-02-16 DIAGNOSIS — E663 Overweight: Secondary | ICD-10-CM

## 2023-02-16 DIAGNOSIS — Z0000 Encounter for general adult medical examination without abnormal findings: Secondary | ICD-10-CM | POA: Diagnosis not present

## 2023-02-16 DIAGNOSIS — Z13220 Encounter for screening for lipoid disorders: Secondary | ICD-10-CM | POA: Diagnosis not present

## 2023-02-16 DIAGNOSIS — E559 Vitamin D deficiency, unspecified: Secondary | ICD-10-CM | POA: Diagnosis not present

## 2023-02-16 DIAGNOSIS — Z7689 Persons encountering health services in other specified circumstances: Secondary | ICD-10-CM | POA: Diagnosis not present

## 2023-02-16 MED ORDER — TOPIRAMATE 50 MG PO TABS
50.0000 mg | ORAL_TABLET | Freq: Two times a day (BID) | ORAL | 0 refills | Status: DC
Start: 2023-02-16 — End: 2023-05-29

## 2023-02-16 MED ORDER — PHENTERMINE HCL 30 MG PO CAPS
30.0000 mg | ORAL_CAPSULE | ORAL | 2 refills | Status: DC
Start: 2023-02-16 — End: 2023-11-20

## 2023-02-16 NOTE — Patient Instructions (Addendum)
USE BACKUP CONTRACEPTION WHILE ON THESE MEDICATIONS  - Increase phentermine to 30mg  daily - Continue topiramate 50mg  twice daily - Watch for potential side effects of medications including sleep disturbance, anxiety, palpitations, and constipation; advised to report any concerning symptoms - Goal is to continue weight loss; use diet and exercise in addition to the medications - Labs ordered today, obtain these and I will reach out with results

## 2023-02-16 NOTE — Assessment & Plan Note (Signed)
SUBJECTIVE: - Reports weight loss, down to 178 lbs from 204 lbs in April - Denies any issues with current medications, including phentermine 15mg  - Denies sleep disturbances, anxiety, or nervousness on current regimen - Bowel movements occurring once daily to every other day  Relevant Past Medical History: - Medications: phentermine 15mg , topiramate 50mg  BID  OBJECTIVE: - Vital signs stable - Cardiac exam: Regular rate and rhythm, normal S1 and S2, no murmurs, rubs or gallops - Abdominal exam: Normoactive bowel sounds, soft, nontender, no hepatosplenomegaly - Recent labs reordered  ASSESSMENT & PLAN: - Increase phentermine to 30mg  daily - Continue topiramate 50mg  BID - Counseled on potential side effects of medications including sleep disturbance, anxiety, palpitations, and constipation; advised to report any concerning symptoms - Goal is to continue weight loss; has lost 26 lbs total since starting medications in April  - Labs ordered today to monitor safety of medications

## 2023-02-16 NOTE — Progress Notes (Signed)
     Primary Care / Sports Medicine Office Visit  Patient Information:  Patient ID: ASPIN TIMLIN, female DOB: Jan 26, 1991 Age: 32 y.o. MRN: [id number]   Jaime Mcintosh is a pleasant 32 y.o. female presenting with the following:  Chief Complaint  Patient presents with   Weight Check    Down 5 lbs    Vitals:   02/16/23 0803  BP: 102/74  Pulse: 98  SpO2: 97%   Vitals:   02/16/23 0803  Weight: 178 lb (80.7 kg)  Height: 5\' 4"  (1.626 m)   Body mass index is 30.55 kg/m.  No results found.   Independent interpretation of notes and tests performed by another provider:   None  Procedures performed:   None  Pertinent History, Exam, Impression, and Recommendations:   Problem List Items Addressed This Visit       Other   Encounter for weight management    SUBJECTIVE: - Reports weight loss, down to 178 lbs from 204 lbs in April - Denies any issues with current medications, including phentermine 15mg  - Denies sleep disturbances, anxiety, or nervousness on current regimen - Bowel movements occurring once daily to every other day  Relevant Past Medical History: - Medications: phentermine 15mg , topiramate 50mg  BID  OBJECTIVE: - Vital signs stable - Cardiac exam: Regular rate and rhythm, normal S1 and S2, no murmurs, rubs or gallops - Abdominal exam: Normoactive bowel sounds, soft, nontender, no hepatosplenomegaly - Recent labs reordered  ASSESSMENT & PLAN: - Increase phentermine to 30mg  daily - Continue topiramate 50mg  BID - Counseled on potential side effects of medications including sleep disturbance, anxiety, palpitations, and constipation; advised to report any concerning symptoms - Goal is to continue weight loss; has lost 26 lbs total since starting medications in April  - Labs ordered today to monitor safety of medications      Relevant Medications   phentermine 30 MG capsule   topiramate (TOPAMAX) 50 MG tablet   Other Visit Diagnoses     BMI  35.0-35.9,adult            Orders & Medications Medications:  Meds ordered this encounter  Medications   phentermine 30 MG capsule    Sig: Take 1 capsule (30 mg total) by mouth every morning.    Dispense:  30 capsule    Refill:  2   topiramate (TOPAMAX) 50 MG tablet    Sig: Take 1 tablet (50 mg total) by mouth 2 (two) times daily.    Dispense:  180 tablet    Refill:  0   No orders of the defined types were placed in this encounter.    No follow-ups on file.     Jerrol Banana, MD, [facility]   Primary Care Sports Medicine Primary Care and Sports Medicine at [facility]

## 2023-02-17 LAB — LIPID PANEL
Chol/HDL Ratio: 3.2 {ratio} (ref 0.0–4.4)
Cholesterol, Total: 189 mg/dL (ref 100–199)
HDL: 60 mg/dL (ref >39)
LDL Chol Calc (NIH): 109 mg/dL — ABNORMAL HIGH (ref 0–99)
Triglycerides: 113 mg/dL (ref 0–149)
VLDL Cholesterol Cal: 20 mg/dL (ref 5–40)

## 2023-02-17 LAB — COMPREHENSIVE METABOLIC PANEL
ALT: 15 [IU]/L (ref 0–32)
AST: 14 [IU]/L (ref 0–40)
Albumin: 4.1 g/dL (ref 4.0–5.0)
Alkaline Phosphatase: 79 [IU]/L (ref 44–121)
BUN/Creatinine Ratio: 14 (ref 9–23)
BUN: 14 mg/dL (ref 6–20)
Bilirubin Total: 1.1 mg/dL (ref 0.0–1.2)
CO2: 20 mmol/L (ref 20–29)
Calcium: 9.1 mg/dL (ref 8.7–10.2)
Chloride: 107 mmol/L — ABNORMAL HIGH (ref 96–106)
Creatinine, Ser: 1.03 mg/dL — ABNORMAL HIGH (ref 0.57–1.00)
Globulin, Total: 2.4 g/dL (ref 1.5–4.5)
Glucose: 82 mg/dL (ref 70–99)
Potassium: 4.1 mmol/L (ref 3.5–5.2)
Sodium: 141 mmol/L (ref 134–144)
Total Protein: 6.5 g/dL (ref 6.0–8.5)
eGFR: 75 mL/min/{1.73_m2} (ref >59)

## 2023-02-17 LAB — CBC
Hematocrit: 42.7 % (ref 34.0–46.6)
Hemoglobin: 13.7 g/dL (ref 11.1–15.9)
MCH: 30 pg (ref 26.6–33.0)
MCHC: 32.1 g/dL (ref 31.5–35.7)
MCV: 93 fL (ref 79–97)
Platelets: 417 10*3/uL (ref 150–450)
RBC: 4.57 x10E6/uL (ref 3.77–5.28)
RDW: 12.8 % (ref 11.7–15.4)
WBC: 9 10*3/uL (ref 3.4–10.8)

## 2023-02-17 LAB — TSH: TSH: 2.45 u[IU]/mL (ref 0.450–4.500)

## 2023-02-17 LAB — VITAMIN D 25 HYDROXY (VIT D DEFICIENCY, FRACTURES): Vit D, 25-Hydroxy: 28 ng/mL — ABNORMAL LOW (ref 30.0–100.0)

## 2023-02-20 ENCOUNTER — Other Ambulatory Visit: Payer: Self-pay | Admitting: Family Medicine

## 2023-02-20 DIAGNOSIS — E559 Vitamin D deficiency, unspecified: Secondary | ICD-10-CM

## 2023-02-20 MED ORDER — VITAMIN D (ERGOCALCIFEROL) 1.25 MG (50000 UNIT) PO CAPS: 50000.0000 [IU] | ORAL_CAPSULE | ORAL | 0 refills | Status: DC

## 2023-02-24 ENCOUNTER — Other Ambulatory Visit: Payer: Self-pay | Admitting: Family Medicine

## 2023-02-24 DIAGNOSIS — Z0000 Encounter for general adult medical examination without abnormal findings: Secondary | ICD-10-CM

## 2023-02-24 DIAGNOSIS — G8929 Other chronic pain: Secondary | ICD-10-CM

## 2023-02-26 NOTE — Telephone Encounter (Signed)
Requested Prescriptions  Pending Prescriptions Disp Refills   diclofenac (VOLTAREN) 50 MG EC tablet [Pharmacy Med Name: Diclofenac Sodium 50 MG Oral Tablet Delayed Release] 180 tablet 0    Sig: TAKE 1 TABLET BY MOUTH TWICE DAILY AS NEEDED FOR  HEADACHE     Analgesics:  NSAIDS Failed - 02/24/2023  6:45 AM      Failed - Manual Review: Labs are only required if the patient has taken medication for more than 8 weeks.      Failed - Cr in normal range and within 360 days    Creatinine, Ser  Date Value Ref Range Status  02/16/2023 1.03 (H) 0.57 - 1.00 mg/dL Final   Creatinine, Urine  Date Value Ref Range Status  07/02/2016 82 mg/dL Final         Passed - HGB in normal range and within 360 days    Hemoglobin  Date Value Ref Range Status  02/16/2023 13.7 11.1 - 15.9 g/dL Final         Passed - PLT in normal range and within 360 days    Platelets  Date Value Ref Range Status  02/16/2023 417 150 - 450 x10E3/uL Final         Passed - HCT in normal range and within 360 days    Hematocrit  Date Value Ref Range Status  02/16/2023 42.7 34.0 - 46.6 % Final         Passed - eGFR is 30 or above and within 360 days    GFR calc Af Amer  Date Value Ref Range Status  03/11/2019 118 >59 mL/min/1.73 Final   GFR calc non Af Amer  Date Value Ref Range Status  03/11/2019 102 >59 mL/min/1.73 Final   GFR  Date Value Ref Range Status  12/31/2012 81.26 >60.00 mL/min Final   eGFR  Date Value Ref Range Status  02/16/2023 75 >59 mL/min/1.73 Final         Passed - Patient is not pregnant      Passed - Valid encounter within last 12 months    Recent Outpatient Visits           1 week ago BMI 35.0-35.9,adult   [place] [facility] at [facility], Ocie Bob, MD   3 months ago Healthcare maintenance   [facility] Medicine at [facility], Ocie Bob, MD   6 months ago Encounter for weight management   [place] [facility] at [facility], Ocie Bob, MD   7 months ago Encounter for weight management   [place] [facility] at [facility], Ocie Bob, MD   2 years ago Annual physical exam   [facility] Medicine at [facility], Ocie Bob, MD       Future Appointments             In 2 months Ashley Royalty, Ocie Bob, MD [facility] Primary Care & Sports Medicine at [facility], [facility]   In 8 months Ashley Royalty, Ocie Bob, MD [facility] Primary Care & Sports Medicine at [facility], [facility]

## 2023-05-21 ENCOUNTER — Ambulatory Visit: Payer: Self-pay | Admitting: Family Medicine

## 2023-05-29 ENCOUNTER — Other Ambulatory Visit: Payer: Self-pay | Admitting: Family Medicine

## 2023-05-29 DIAGNOSIS — Z7689 Persons encountering health services in other specified circumstances: Secondary | ICD-10-CM

## 2023-05-29 NOTE — Telephone Encounter (Signed)
 Requested Prescriptions  Pending Prescriptions Disp Refills   topiramate (TOPAMAX) 50 MG tablet [Pharmacy Med Name: Topiramate 50 MG Oral Tablet] 180 tablet 1    Sig: Take 1 tablet by mouth twice daily     Neurology: Anticonvulsants - topiramate & zonisamide Failed - 05/29/2023  5:23 PM      Failed - Cr in normal range and within 360 days    Creatinine, Ser  Date Value Ref Range Status  02/16/2023 1.03 (H) 0.57 - 1.00 mg/dL Final   Creatinine, Urine  Date Value Ref Range Status  07/02/2016 82 mg/dL Final         Passed - CO2 in normal range and within 360 days    CO2  Date Value Ref Range Status  02/16/2023 20 20 - 29 mmol/L Final         Passed - ALT in normal range and within 360 days    ALT  Date Value Ref Range Status  02/16/2023 15 0 - 32 IU/L Final         Passed - AST in normal range and within 360 days    AST  Date Value Ref Range Status  02/16/2023 14 0 - 40 IU/L Final         Passed - Completed PHQ-2 or PHQ-9 in the last 360 days      Passed - Valid encounter within last 12 months    Recent Outpatient Visits           3 months ago BMI 35.0-35.9,adult   [facility], Ocie Bob, MD   6 months ago Healthcare maintenance   [facility], Ocie Bob, MD   9 months ago Encounter for weight management   [facility], Ocie Bob, MD   10 months ago Encounter for weight management   [facility], Ocie Bob, MD   2 years ago Annual physical exam   [facility] at [facility], Ocie Bob, MD       Future Appointments             In 5 months Ashley Royalty, Ocie Bob, MD [facility] at [facility], [facility]

## 2023-07-24 ENCOUNTER — Encounter: Payer: Self-pay | Admitting: Family Medicine

## 2023-07-24 NOTE — Telephone Encounter (Signed)
 Please review. Virtual visit?  KP

## 2023-07-25 ENCOUNTER — Encounter: Payer: Self-pay | Admitting: Family Medicine

## 2023-07-25 ENCOUNTER — Ambulatory Visit: Admitting: Family Medicine

## 2023-07-25 VITALS — BP 110/74 | HR 105 | Ht 64.0 in | Wt 190.4 lb

## 2023-07-25 DIAGNOSIS — J020 Streptococcal pharyngitis: Secondary | ICD-10-CM | POA: Diagnosis not present

## 2023-07-25 LAB — POCT RAPID STREP A: Strep A Ag: POSITIVE

## 2023-07-25 MED ORDER — PENICILLIN V POTASSIUM 500 MG PO TABS: 500.0000 mg | ORAL_TABLET | Freq: Three times a day (TID) | ORAL | 0 refills | Status: AC

## 2023-07-25 MED ORDER — PREDNISONE 50 MG PO TABS: 50.0000 mg | ORAL_TABLET | Freq: Once | ORAL | 0 refills | Status: AC

## 2023-07-25 NOTE — Progress Notes (Signed)
 Primary Care / Sports Medicine Office Visit  Patient Information:  Patient ID: Jaime Mcintosh, female DOB: [birth year] Age: 34 y.o. MRN: [id number]   KURT AZIMI is a pleasant 33 y.o. female presenting with the following:  Chief Complaint  Patient presents with   Sore Throat    Patient has been exposed to strep in her home. She began having sore throat on    Vitals:   07/25/23 0904  BP: 110/74  Pulse: (!) 105  SpO2: 97%   Vitals:   07/25/23 0904  Weight: 190 lb 6.4 oz (86.4 kg)  Height: 5\' 4"  (1.626 m)   Body mass index is 32.68 kg/m.  No results found.   Independent interpretation of notes and tests performed by another provider:   None  Procedures performed:   None  Pertinent History, Exam, Impression, and Recommendations:   Problem List Items Addressed This Visit     Strep pharyngitis - Primary   History of Present Illness Jaime Mcintosh is a 33 year old female who presents with a sore throat and difficulty swallowing in the setting of a known strep positive family (daughter).  She has been experiencing a sore throat and difficulty swallowing, describing her voice as 'gone' and noting that the symptoms appeared suddenly. She feels feverish and sweaty but has not taken any fever-reducing medications. She has been using honey for throat relief.  Her symptoms began after her young daughter developed a rash, initially suspected to be a tick bite but later identified as a strep related rash after testing positive for strep at her daughter's pediatrician's office. Her daughter is on an amoxicillin course for ten days and is now asymptomatic.  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 No breathing difficulties are reported, but there is significant difficulty swallowing.  Physical Exam HEENT: Marked erythema and swelling at the oropharynx, no exudates, tympanic membranes and canals benign bilaterally. NECK: Bilateral posterior cervical  and submandibular lymphadenopathy. CHEST: Lungs clear to auscultation bilaterally.  Assessment and Plan Streptococcal pharyngitis Acute streptococcal pharyngitis with significant throat swelling and erythema. Dysphagia and aphonia present.  Discussed prednisone use to improve clinical course and reduce risks of significant pharyngeal swelling. Discussed potential for candidiasis with antibiotic use. - Prescribe penicillin, 1 tablet TID for 10 days. - Prescribe a single 50 mg dose of prednisone. - Following prednisone, switch to either ibuprofen 800 mg (4 tablets) 3 times a day OR naproxen 440 mg (2 tablets) 2 times a day until throat swelling has resolved. - Advise saltwater gargles. - Recommend acetaminophen (Tylenol) for additional pain control. - Encourage hydration with electrolyte-rich fluids if not eating. - Provide out-of-work note from 4/14 to 4/21. - Advise masking for a few days upon return to work. - Instruct to monitor for symptoms of candidiasis and report if she occurs.  We would send in a single dose of Diflucan for this.      Relevant Medications   penicillin v potassium (VEETID) 500 MG tablet   predniSONE (DELTASONE) 50 MG tablet   Other Relevant Orders   POCT Rapid Strep A (Completed)     Orders & Medications Medications:  Meds ordered this encounter  Medications   penicillin v potassium (VEETID) 500 MG tablet    Sig: Take 1 tablet (500 mg total) by mouth 3 (three) times daily for 10 days.    Dispense:  30 tablet    Refill:  0   predniSONE (DELTASONE) 50 MG tablet  Sig: Take 1 tablet (50 mg total) by mouth once for 1 dose.    Dispense:  1 tablet    Refill:  0   No orders of the defined types were placed in this encounter.    Return if symptoms worsen or fail to improve.     Ma Saupe, MD, [facility]   Primary Care Sports Medicine [facility] at [facility]

## 2023-07-25 NOTE — Patient Instructions (Signed)
 Patient Plan for Post-Visit Guidance  1. Medication:    - Take penicillin, 1 tablet three times a day for 10 days.    - Take a single 50 mg dose of prednisone.    - After prednisone, choose either:      - Ibuprofen 800 mg (4 tablets) three times a day, or      - Naproxen 440 mg (2 tablets) twice a day,      until throat swelling resolves.  2. Symptom Management:    - Use saltwater gargles for throat relief.    - Take acetaminophen (Tylenol) as needed for additional pain control.    - Stay hydrated with electrolyte-rich fluids, especially if not eating well.  3. Work and Safety:    - You are excused from work from 4/14 to 4/21.    - Wear a mask for a few days when you return to work.  4. Monitoring:    - Watch for any symptoms of candidiasis (such as white patches in the mouth or throat) and report them. If they occur, a single dose of Diflucan will be provided.  Please follow these steps and contact us  if you have any questions or concerns.

## 2023-07-25 NOTE — Assessment & Plan Note (Signed)
 History of Present Illness Jaime Mcintosh is a 33 year old female who presents with a sore throat and difficulty swallowing in the setting of a known strep positive family (daughter).  She has been experiencing a sore throat and difficulty swallowing, describing her voice as 'gone' and noting that the symptoms appeared suddenly. She feels feverish and sweaty but has not taken any fever-reducing medications. She has been using honey for throat relief.  Her symptoms began after her young daughter developed a rash, initially suspected to be a tick bite but later identified as a strep related rash after testing positive for strep at her daughter's pediatrician's office. Her daughter is on an amoxicillin course for ten days and is now asymptomatic.  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 No breathing difficulties are reported, but there is significant difficulty swallowing.  Physical Exam HEENT: Marked erythema and swelling at the oropharynx, no exudates, tympanic membranes and canals benign bilaterally. NECK: Bilateral posterior cervical and submandibular lymphadenopathy. CHEST: Lungs clear to auscultation bilaterally.  Assessment and Plan Streptococcal pharyngitis Acute streptococcal pharyngitis with significant throat swelling and erythema. Dysphagia and aphonia present.  Discussed prednisone use to improve clinical course and reduce risks of significant pharyngeal swelling. Discussed potential for candidiasis with antibiotic use. - Prescribe penicillin, 1 tablet TID for 10 days. - Prescribe a single 50 mg dose of prednisone. - Following prednisone, switch to either ibuprofen 800 mg (4 tablets) 3 times a day OR naproxen 440 mg (2 tablets) 2 times a day until throat swelling has resolved. - Advise saltwater gargles. - Recommend acetaminophen (Tylenol) for additional pain control. - Encourage hydration with electrolyte-rich fluids if not eating. - Provide out-of-work note  from 4/14 to 4/21. - Advise masking for a few days upon return to work. - Instruct to monitor for symptoms of candidiasis and report if she occurs.  We would send in a single dose of Diflucan for this.

## 2023-11-20 ENCOUNTER — Encounter: Payer: Self-pay | Admitting: Family Medicine

## 2023-11-20 ENCOUNTER — Ambulatory Visit (INDEPENDENT_AMBULATORY_CARE_PROVIDER_SITE_OTHER): Payer: Self-pay | Admitting: Family Medicine

## 2023-11-20 VITALS — BP 100/64 | HR 80 | Ht 64.0 in | Wt 212.4 lb

## 2023-11-20 DIAGNOSIS — E6609 Other obesity due to excess calories: Secondary | ICD-10-CM | POA: Diagnosis not present

## 2023-11-20 DIAGNOSIS — G44229 Chronic tension-type headache, not intractable: Secondary | ICD-10-CM

## 2023-11-20 DIAGNOSIS — G478 Other sleep disorders: Secondary | ICD-10-CM | POA: Insufficient documentation

## 2023-11-20 DIAGNOSIS — Z8669 Personal history of other diseases of the nervous system and sense organs: Secondary | ICD-10-CM

## 2023-11-20 DIAGNOSIS — Z0000 Encounter for general adult medical examination without abnormal findings: Secondary | ICD-10-CM

## 2023-11-20 DIAGNOSIS — L719 Rosacea, unspecified: Secondary | ICD-10-CM

## 2023-11-20 DIAGNOSIS — G8929 Other chronic pain: Secondary | ICD-10-CM

## 2023-11-20 MED ORDER — SUMATRIPTAN SUCCINATE 50 MG PO TABS
50.0000 mg | ORAL_TABLET | ORAL | 0 refills | Status: AC | PRN
Start: 2023-11-20 — End: ?

## 2023-11-20 MED ORDER — METHOCARBAMOL 500 MG PO TABS: 500.0000 mg | ORAL_TABLET | Freq: Four times a day (QID) | ORAL | 0 refills | Status: AC | PRN

## 2023-11-20 NOTE — Patient Instructions (Addendum)
-   Obtain fasting labs with orders provided (can have water or black coffee but otherwise no food or drink x 8 hours before labs) - Review information provided - Attend eye doctor annually, dentist every 6 months, work towards or maintain 30 minutes of moderate intensity physical activity at least 5 days per week, and consume a balanced diet - Return in 1 year for physical - Contact us  for any questions between now and then    Patient Plan for Post-Visit Guidance  Headaches (Tension-Type and Possible Migraine) - Take baclofen as prescribed for tension headaches.  Side effect may be drowsiness. - Take Imitrex  (sumatriptan ) as prescribed for migraine headaches. - Report how well these medications work for you at your next visit.  Overdue Cervical Cancer Screening - Referral placed to gynecology for a Pap exam.  Sleep Apnea - Referral placed to sleep medicine for a home-based sleep study.  Obesity and Weight Management - Contact your insurance to check coverage for weight loss medications (Wegovy or Zepbound), especially Zepbound if you have sleep apnea.  See separate MyChart message for additional details. - Discuss weight management options at your next visit.  Rosacea - Follow up with dermatology for ongoing rosacea management.  Red Flags - If you experience sudden severe headache, vision changes, weakness, numbness, difficulty speaking, chest pain, shortness of breath, or any new or worsening symptoms, seek medical attention promptly.

## 2023-11-20 NOTE — Assessment & Plan Note (Signed)
 Rosacea Rosacea management requires dermatology follow-up. - Instruct to follow up with dermatology.

## 2023-11-20 NOTE — Assessment & Plan Note (Signed)
 See additional assessment(s) for plan details.

## 2023-11-20 NOTE — Assessment & Plan Note (Signed)
 Cephalalgia (headache) - Headaches occur approximately once a week or every other week - Headache types include tension headaches and 'piercing' pain located almost behind the eyes - Topiramate  previously reduced headache frequency - Uses a muscle relaxer before bed to manage tension headaches due to sedative effects - No recent congestion or nasal stuffiness  Headache disorder (tension-type and possible migraine) Reports tension-type headaches and possible migraines weekly to biweekly. Muscle relaxers previously used with some relief but cause grogginess. - Prescribe baclofen for tension headaches. - Prescribe Imitrex  (sumatriptan ) for suspected migraines. - Advise to report medication effectiveness for further evaluation.

## 2023-11-20 NOTE — Assessment & Plan Note (Signed)
 Suspected sleep apnea Suspected sleep apnea discussed as a potential factor in obesity and overall health. Explained risks of untreated sleep apnea. - Refer to sleep medicine for a home-based sleep study.

## 2023-11-20 NOTE — Assessment & Plan Note (Signed)
 Weight management - Interested in resuming previous weight management regimen with topiramate  and phentermine  - Discontinued medication after missing a prior appointment - Incident occurred where her daughter may have accessed her medications, resulting in an emergency room visit; daughter was asymptomatic and had no evidence of ingestion  Obesity Discussed obesity management with phentermine  and topiramate . Discussed Zepbound (tirzepatide) as an alternative, potentially covered if associated with sleep apnea. Emphasized weight loss benefits on health metrics. - Instruct to contact insurance regarding coverage for weight loss medications (Wegovy/Zepbound) and specifically Zepbound for sleep apnea. - Refer to sleep medicine for evaluation and potential sleep study.

## 2023-11-20 NOTE — Progress Notes (Signed)
 Annual Physical Exam Visit  Patient Information:  Patient ID: Jaime Mcintosh, female DOB: 1990/07/30 Age: 33 y.o. MRN: [id number]   Subjective:   CC: Annual Physical Exam  HPI:  Jaime Mcintosh is here for their annual physical.  I reviewed the past medical history, family history, social history, surgical history, and allergies today and changes were made as necessary.  Please see the problem list section below for additional details.  Past Medical History: Past Medical History:  Diagnosis Date   Carpal tunnel syndrome, bilateral 11/16/2022   Headache    hx migraines   History of pre-eclampsia 06/2016   mild   Past Surgical History: Past Surgical History:  Procedure Laterality Date   NO PAST SURGERIES     Family History: Family History  Problem Relation Age of Onset   Skin cancer Mother    Hyperlipidemia Father    Hypokalemia Sister    Arthritis Maternal Grandmother    Hypertension Maternal Grandmother    Varicose Veins Maternal Grandmother    Heart disease Maternal Grandfather    Heart attack Maternal Grandfather    Coronary artery disease Maternal Grandfather    Thyroid  disease Paternal Grandmother    Cataracts Paternal Grandmother    Diabetes Paternal Grandfather    Stroke Neg Hx    Breast cancer Neg Hx    Allergies: No Known Allergies Health Maintenance: Health Maintenance  Topic Date Due   Cervical Cancer Screening (HPV/Pap Cotest)  06/22/2022   COVID-19 Vaccine (1 - 2024-25 season) 12/06/2023 (Originally 12/10/2022)   INFLUENZA VACCINE  07/08/2024 (Originally 11/09/2023)   Hepatitis B Vaccines (1 of 3 - 19+ 3-dose series) 11/19/2024 (Originally 06/22/2011)   HPV VACCINES (1 - 3-dose SCDM series) 11/19/2024 (Originally 06/22/2019)   DTaP/Tdap/Td (3 - Td or Tdap) 06/11/2030   Hepatitis C Screening  Completed   HIV Screening  Completed   Meningococcal B Vaccine  Aged Out    HM Colonoscopy   This patient has no relevant Health Maintenance data.     Medications: Current Outpatient Medications on File Prior to Visit  Medication Sig Dispense Refill   aspirin -acetaminophen -caffeine (EXCEDRIN MIGRAINE) 250-250-65 MG tablet Take by mouth every 6 (six) hours as needed for headache.     No current facility-administered medications on file prior to visit.    Objective:   Vitals:   11/20/23 0813  BP: 100/64  Pulse: 80  SpO2: 96%   Vitals:   11/20/23 0813  Weight: 212 lb 6.4 oz (96.3 kg)  Height: 5' 4 (1.626 m)   Body mass index is 36.46 kg/m.  General: Well Developed, well nourished, and in no acute distress.  Neuro: Alert and oriented x3, extra-ocular muscles intact, sensation grossly intact. Cranial nerves II through XII are grossly intact, motor, sensory, and coordinative functions are intact. HEENT: Normocephalic, atraumatic, neck supple, no masses, no lymphadenopathy, thyroid  nonenlarged. Oropharynx, nasopharynx, external ear canals are unremarkable. Skin: Warm and dry, no rashes noted.  Rosacea. Cardiac: Regular rate and rhythm, no murmurs rubs or gallops. No peripheral edema. Pulses symmetric. Respiratory: Clear to auscultation bilaterally. Speaking in full sentences.  Abdominal: Soft, nontender, nondistended, positive bowel sounds, no masses, no organomegaly. Musculoskeletal: Stable, and with full range of motion.  Impression and Recommendations:   The patient was counselled, risk factors were discussed, and anticipatory guidance given.  Problem List Items Addressed This Visit     Chronic headaches   Cephalalgia (headache) - Headaches occur approximately once a week or every other week -  Headache types include tension headaches and 'piercing' pain located almost behind the eyes - Topiramate  previously reduced headache frequency - Uses a muscle relaxer before bed to manage tension headaches due to sedative effects - No recent congestion or nasal stuffiness  Headache disorder (tension-type and possible  migraine) Reports tension-type headaches and possible migraines weekly to biweekly. Muscle relaxers previously used with some relief but cause grogginess. - Prescribe baclofen for tension headaches. - Prescribe Imitrex  (sumatriptan ) for suspected migraines. - Advise to report medication effectiveness for further evaluation.      Relevant Medications   methocarbamol  (ROBAXIN ) 500 MG tablet   SUMAtriptan  (IMITREX ) 50 MG tablet   Healthcare maintenance - Primary   Annual examination completed, risk stratification labs ordered, anticipatory guidance provided.  We will follow labs once resulted.  Overdue cervical cancer screening Overdue for Pap exam since last year. Discussed importance of cervical cancer screening. - Refer to gynecology for Pap exam.      Relevant Orders   CBC   Comprehensive metabolic panel with GFR   Hemoglobin A1c   Lipid panel   Ambulatory referral to Obstetrics / Gynecology   History of migraine   See additional assessment(s) for plan details.      Relevant Medications   SUMAtriptan  (IMITREX ) 50 MG tablet   Non-restorative sleep   Suspected sleep apnea Suspected sleep apnea discussed as a potential factor in obesity and overall health. Explained risks of untreated sleep apnea. - Refer to sleep medicine for a home-based sleep study.      Relevant Orders   Ambulatory referral to Sleep Studies   Obesity   Weight management - Interested in resuming previous weight management regimen with topiramate  and phentermine  - Discontinued medication after missing a prior appointment - Incident occurred where her daughter may have accessed her medications, resulting in an emergency room visit; daughter was asymptomatic and had no evidence of ingestion  Obesity Discussed obesity management with phentermine  and topiramate . Discussed Zepbound (tirzepatide) as an alternative, potentially covered if associated with sleep apnea. Emphasized weight loss benefits on health  metrics. - Instruct to contact insurance regarding coverage for weight loss medications (Wegovy/Zepbound) and specifically Zepbound for sleep apnea. - Refer to sleep medicine for evaluation and potential sleep study.      Rosacea   Rosacea Rosacea management requires dermatology follow-up. - Instruct to follow up with dermatology.        Orders & Medications Medications:  Meds ordered this encounter  Medications   methocarbamol  (ROBAXIN ) 500 MG tablet    Sig: Take 1 tablet (500 mg total) by mouth every 6 (six) hours as needed for muscle spasms.    Dispense:  30 tablet    Refill:  0   SUMAtriptan  (IMITREX ) 50 MG tablet    Sig: Take 1 tablet (50 mg total) by mouth every 2 (two) hours as needed for migraine. May repeat in 2 hours if headache persists or recurs.    Dispense:  30 tablet    Refill:  0   Orders Placed This Encounter  Procedures   CBC   Comprehensive metabolic panel with GFR   Hemoglobin A1c   Lipid panel   Ambulatory referral to Obstetrics / Gynecology   Ambulatory referral to Sleep Studies     No follow-ups on file.    Selinda JINNY Ku, MD, [facility]   Primary Care Sports Medicine Primary Care and Sports Medicine at [facility]

## 2023-11-20 NOTE — Assessment & Plan Note (Addendum)
 Annual examination completed, risk stratification labs ordered, anticipatory guidance provided.  We will follow labs once resulted.  Overdue cervical cancer screening Overdue for Pap exam since last year. Discussed importance of cervical cancer screening. - Refer to gynecology for Pap exam.

## 2023-11-27 NOTE — Telephone Encounter (Signed)
 Please review patient response.  JM

## 2023-11-28 DIAGNOSIS — Z0000 Encounter for general adult medical examination without abnormal findings: Secondary | ICD-10-CM | POA: Diagnosis not present

## 2023-11-28 DIAGNOSIS — Z01419 Encounter for gynecological examination (general) (routine) without abnormal findings: Secondary | ICD-10-CM | POA: Diagnosis not present

## 2023-11-28 DIAGNOSIS — Z124 Encounter for screening for malignant neoplasm of cervix: Secondary | ICD-10-CM | POA: Diagnosis not present

## 2023-11-28 DIAGNOSIS — Z3202 Encounter for pregnancy test, result negative: Secondary | ICD-10-CM | POA: Diagnosis not present

## 2023-11-29 ENCOUNTER — Ambulatory Visit (INDEPENDENT_AMBULATORY_CARE_PROVIDER_SITE_OTHER): Admitting: Sleep Medicine

## 2023-11-29 ENCOUNTER — Encounter: Payer: Self-pay | Admitting: Sleep Medicine

## 2023-11-29 VITALS — BP 110/76 | HR 90 | Temp 97.9°F | Ht 64.0 in | Wt 216.0 lb

## 2023-11-29 DIAGNOSIS — G4710 Hypersomnia, unspecified: Secondary | ICD-10-CM | POA: Diagnosis not present

## 2023-11-29 DIAGNOSIS — Z6837 Body mass index (BMI) 37.0-37.9, adult: Secondary | ICD-10-CM | POA: Diagnosis not present

## 2023-11-29 DIAGNOSIS — E669 Obesity, unspecified: Secondary | ICD-10-CM | POA: Diagnosis not present

## 2023-11-29 DIAGNOSIS — G4700 Insomnia, unspecified: Secondary | ICD-10-CM

## 2023-11-29 DIAGNOSIS — G4733 Obstructive sleep apnea (adult) (pediatric): Secondary | ICD-10-CM

## 2023-11-29 DIAGNOSIS — F5104 Psychophysiologic insomnia: Secondary | ICD-10-CM

## 2023-11-29 LAB — CBC
Hematocrit: 42.9 % (ref 34.0–46.6)
Hemoglobin: 14.2 g/dL (ref 11.1–15.9)
MCH: 30.9 pg (ref 26.6–33.0)
MCHC: 33.1 g/dL (ref 31.5–35.7)
MCV: 93 fL (ref 79–97)
Platelets: 429 x10E3/uL (ref 150–450)
RBC: 4.6 x10E6/uL (ref 3.77–5.28)
RDW: 12.3 % (ref 11.7–15.4)
WBC: 10.6 x10E3/uL (ref 3.4–10.8)

## 2023-11-29 LAB — COMPREHENSIVE METABOLIC PANEL WITH GFR
ALT: 21 IU/L (ref 0–32)
AST: 17 IU/L (ref 0–40)
Albumin: 4.3 g/dL (ref 3.9–4.9)
Alkaline Phosphatase: 74 IU/L (ref 44–121)
BUN/Creatinine Ratio: 13 (ref 9–23)
BUN: 11 mg/dL (ref 6–20)
Bilirubin Total: 0.6 mg/dL (ref 0.0–1.2)
CO2: 23 mmol/L (ref 20–29)
Calcium: 9.7 mg/dL (ref 8.7–10.2)
Chloride: 100 mmol/L (ref 96–106)
Creatinine, Ser: 0.85 mg/dL (ref 0.57–1.00)
Globulin, Total: 2.7 g/dL (ref 1.5–4.5)
Glucose: 74 mg/dL (ref 70–99)
Potassium: 4.4 mmol/L (ref 3.5–5.2)
Sodium: 137 mmol/L (ref 134–144)
Total Protein: 7 g/dL (ref 6.0–8.5)
eGFR: 94 mL/min/1.73 (ref >59)

## 2023-11-29 LAB — HEMOGLOBIN A1C
Est. average glucose Bld gHb Est-mCnc: 105 mg/dL
Hgb A1c MFr Bld: 5.3 % (ref 4.8–5.6)

## 2023-11-29 LAB — LIPID PANEL
Chol/HDL Ratio: 3.9 ratio (ref 0.0–4.4)
Cholesterol, Total: 205 mg/dL — ABNORMAL HIGH (ref 100–199)
HDL: 53 mg/dL (ref >39)
LDL Chol Calc (NIH): 90 mg/dL (ref 0–99)
Triglycerides: 379 mg/dL — ABNORMAL HIGH (ref 0–149)
VLDL Cholesterol Cal: 62 mg/dL — ABNORMAL HIGH (ref 5–40)

## 2023-11-29 NOTE — Patient Instructions (Signed)
 Jaime Mcintosh

## 2023-11-29 NOTE — Progress Notes (Signed)
 Name:Jaime Mcintosh MRN: [id number] DOB: 01/22/1991   CHIEF COMPLAINT:  EXCESSIVE DAYTIME SLEEPINESS   HISTORY OF PRESENT ILLNESS:  Jaime Mcintosh is a 33 y.o. w/ a h/o migraine headaches and obesity who present for c/o loud snoring and excessive daytime sleepiness which has been present for several years. Reports nocturnal awakenings due to unclear reasons and has difficulty falling back to sleep. Reports a 20 lb weight gain over the last 2 years. Denies morning headaches, RLS symptoms, dream enactment, cataplexy, hypnagogic or hypnapompic hallucinations. Denies a family history of sleep apnea. Denies drowsy driving. Drinks 1-2 cups of coffee and 1 cup of tea daily, occasional alcohol use, denies tobacco or illicit drug use.   Bedtime 8-9 pm Sleep onset 1-2 hours Rise time 6:30 am   EPWORTH SLEEP SCORE     11/29/2023    8:44 AM  Results of the Epworth flowsheet  Sitting and reading 1  Watching TV 1  Sitting, inactive in a public place (e.g. a theatre or a meeting) 0  As a passenger in a car for an hour without a break 1  Lying down to rest in the afternoon when circumstances permit 2  Sitting and talking to someone 0  Sitting quietly after a lunch without alcohol 0  In a car, while stopped for a few minutes in traffic 0  Total score 5    PAST MEDICAL HISTORY :   has a past medical history of Carpal tunnel syndrome, bilateral (11/16/2022), Headache, and History of pre-eclampsia (06/2016).  has a past surgical history that includes No past surgeries. Prior to Admission medications   Medication Sig Start Date End Date Taking? Authorizing Provider  aspirin -acetaminophen -caffeine (EXCEDRIN MIGRAINE) 250-250-65 MG tablet Take by mouth every 6 (six) hours as needed for headache.   Yes [provider]  methocarbamol  (ROBAXIN ) 500 MG tablet Take 1 tablet (500 mg total) by mouth every 6 (six) hours as needed for muscle spasms. 11/20/23  Yes Alvia Selinda PARAS, MD   SUMAtriptan  (IMITREX ) 50 MG tablet Take 1 tablet (50 mg total) by mouth every 2 (two) hours as needed for migraine. May repeat in 2 hours if headache persists or recurs. 11/20/23  Yes Alvia Selinda PARAS, MD   No Known Allergies  FAMILY HISTORY:  family history includes Arthritis in her maternal grandmother; Cataracts in her paternal grandmother; Coronary artery disease in her maternal grandfather; Diabetes in her paternal grandfather; Heart attack in her maternal grandfather; Heart disease in her maternal grandfather; Hyperlipidemia in her father; Hypertension in her maternal grandmother; Hypokalemia in her sister; Skin cancer in her mother; Thyroid  disease in her paternal grandmother; Varicose Veins in her maternal grandmother. SOCIAL HISTORY:  reports that she has never smoked. She has never used smokeless tobacco. She reports that she does not currently use alcohol. She reports that she does not use drugs.   Review of Systems:  Gen:  Denies  fever, sweats, chills weight loss  HEENT: Denies blurred vision, double vision, ear pain, eye pain, hearing loss, nose bleeds, sore throat Cardiac:  No dizziness, chest pain or heaviness, chest tightness,edema, No JVD Resp:   No cough, -sputum production, -shortness of breath,-wheezing, -hemoptysis,  Gi: Denies swallowing difficulty, stomach pain, nausea or vomiting, diarrhea, constipation, bowel incontinence Gu:  Denies bladder incontinence, burning urine Ext:   Denies Joint pain, stiffness or swelling Skin: Denies  skin rash, easy bruising or bleeding or hives Endoc:  Denies polyuria, polydipsia , polyphagia or weight change  Psych:   Denies depression, insomnia or hallucinations  Other:  All other systems negative  VITAL SIGNS: BP 110/76   Pulse 90   Temp 97.9 F (36.6 C)   Ht 5' 4 (1.626 m)   Wt 216 lb (98 kg)   SpO2 98%   BMI 37.08 kg/m    Physical Examination:   General Appearance: No distress  EYES PERRLA, EOM intact.   NECK  Supple, No JVD Pulmonary: normal breath sounds, No wheezing.  CardiovascularNormal S1,S2.  No m/r/g.   Abdomen: Benign, Soft, non-tender. Skin:   warm, no rashes, no ecchymosis  Extremities: normal, no cyanosis, clubbing. Neuro:without focal findings,  speech normal  PSYCHIATRIC: Mood, affect within normal limits.   ASSESSMENT AND PLAN  OSA I suspect that OSA is likely present due to clinical presentation. Discussed the consequences of untreated sleep apnea. Advised not to drive drowsy for safety of patient and others. Will complete further evaluation with a home sleep study and follow up to review results.    Obesity Counseled patient on diet and lifestyle modification.  Insomnia Counseled patient on stimulus control and improving sleep hygiene practices.    MEDICATION ADJUSTMENTS/LABS AND TESTS ORDERED: Recommend Sleep Study   Patient  satisfied with Plan of action and management. All questions answered  Follow up to review HST results and treatment plan.   I spent a total of 33 minutes reviewing chart data, face-to-face evaluation with the patient, counseling and coordination of care as detailed above.    [name], M.D.  Sleep Medicine [facility]

## 2023-12-03 ENCOUNTER — Ambulatory Visit: Payer: Self-pay | Admitting: Family Medicine

## 2023-12-13 ENCOUNTER — Encounter

## 2023-12-13 DIAGNOSIS — G4730 Sleep apnea, unspecified: Secondary | ICD-10-CM | POA: Diagnosis not present

## 2023-12-13 DIAGNOSIS — G4733 Obstructive sleep apnea (adult) (pediatric): Secondary | ICD-10-CM

## 2023-12-24 DIAGNOSIS — G4733 Obstructive sleep apnea (adult) (pediatric): Secondary | ICD-10-CM | POA: Diagnosis not present

## 2023-12-25 ENCOUNTER — Ambulatory Visit: Payer: Self-pay

## 2024-11-20 ENCOUNTER — Encounter: Admitting: Family Medicine
